# Patient Record
Sex: Female | Born: 1975 | Race: White | Hispanic: No | Marital: Married | Smoking: Never smoker
Health system: Southern US, Community
[De-identification: ages and names within clinical notes are randomized; demographics above are authoritative.]

---

## 2000-06-22 HISTORY — PX: TUBAL LIGATION: SHX77

## 2003-04-26 ENCOUNTER — Encounter: Payer: Self-pay | Admitting: Family Medicine

## 2003-04-26 LAB — CONVERTED CEMR LAB: Blood Glucose, Fasting: 86 mg/dL

## 2003-09-29 ENCOUNTER — Ambulatory Visit (HOSPITAL_COMMUNITY): Admission: RE | Admit: 2003-09-29 | Discharge: 2003-09-29 | Payer: Self-pay | Admitting: Neurology

## 2004-06-30 ENCOUNTER — Ambulatory Visit: Payer: Self-pay | Admitting: Family Medicine

## 2005-06-03 ENCOUNTER — Ambulatory Visit: Payer: Self-pay | Admitting: Family Medicine

## 2007-04-25 ENCOUNTER — Ambulatory Visit: Payer: Self-pay | Admitting: Family Medicine

## 2007-10-10 ENCOUNTER — Ambulatory Visit: Payer: Self-pay | Admitting: Family Medicine

## 2007-10-11 ENCOUNTER — Encounter: Payer: Self-pay | Admitting: Family Medicine

## 2007-12-01 ENCOUNTER — Ambulatory Visit: Payer: Self-pay | Admitting: Family Medicine

## 2007-12-01 DIAGNOSIS — M79609 Pain in unspecified limb: Secondary | ICD-10-CM | POA: Insufficient documentation

## 2007-12-12 ENCOUNTER — Ambulatory Visit: Payer: Self-pay | Admitting: Family Medicine

## 2007-12-13 ENCOUNTER — Encounter (INDEPENDENT_AMBULATORY_CARE_PROVIDER_SITE_OTHER): Payer: Self-pay | Admitting: Internal Medicine

## 2008-06-27 ENCOUNTER — Ambulatory Visit: Payer: Self-pay | Admitting: Family Medicine

## 2008-06-27 DIAGNOSIS — R358 Other polyuria: Secondary | ICD-10-CM

## 2008-06-27 DIAGNOSIS — N393 Stress incontinence (female) (male): Secondary | ICD-10-CM | POA: Insufficient documentation

## 2008-06-27 DIAGNOSIS — N3946 Mixed incontinence: Secondary | ICD-10-CM | POA: Insufficient documentation

## 2008-06-27 DIAGNOSIS — R3589 Other polyuria: Secondary | ICD-10-CM | POA: Insufficient documentation

## 2008-06-27 HISTORY — DX: Mixed incontinence: N39.46

## 2008-07-13 ENCOUNTER — Ambulatory Visit: Payer: Self-pay | Admitting: Family Medicine

## 2008-07-13 DIAGNOSIS — M546 Pain in thoracic spine: Secondary | ICD-10-CM

## 2008-07-13 DIAGNOSIS — N390 Urinary tract infection, site not specified: Secondary | ICD-10-CM | POA: Insufficient documentation

## 2008-07-13 LAB — CONVERTED CEMR LAB
Bilirubin Urine: NEGATIVE
Glucose, Urine, Semiquant: NEGATIVE
Nitrite: POSITIVE
Specific Gravity, Urine: 1.01
Urobilinogen, UA: 0.2
pH: 6

## 2008-12-31 ENCOUNTER — Ambulatory Visit: Payer: Self-pay | Admitting: Family Medicine

## 2008-12-31 DIAGNOSIS — L259 Unspecified contact dermatitis, unspecified cause: Secondary | ICD-10-CM | POA: Insufficient documentation

## 2008-12-31 LAB — CONVERTED CEMR LAB
Ketones, urine, test strip: NEGATIVE
Nitrite: NEGATIVE
Urobilinogen, UA: 0.2

## 2010-01-23 ENCOUNTER — Encounter (INDEPENDENT_AMBULATORY_CARE_PROVIDER_SITE_OTHER): Payer: Self-pay | Admitting: *Deleted

## 2010-04-01 ENCOUNTER — Ambulatory Visit: Payer: Self-pay | Admitting: Internal Medicine

## 2010-04-01 LAB — CONVERTED CEMR LAB
Glucose, Urine, Semiquant: NEGATIVE
Ketones, urine, test strip: NEGATIVE
Nitrite: NEGATIVE
Protein, U semiquant: 30

## 2010-07-22 NOTE — Assessment & Plan Note (Signed)
Summary: ?UTI/CLE   Vital Signs:  Patient profile:   35 year old female Height:      62 inches Weight:      130.75 pounds BMI:     24.00 Temp:     98.5 degrees F oral Pulse rate:   84 / minute Pulse rhythm:   regular BP sitting:   108 / 60  (left arm) Cuff size:   regular  Vitals Entered By: Selena Batten Dance CMA Duncan Dull) (April 01, 2010 4:28 PM) CC: ? UTI   History of Present Illness: CC: ?UTI  6-7 d h/o UTI sxs, feeling pressure when walking and urgency, shock feeling when voiding, + frequency.  has been drinking more water and less sodas, but still with sxs.  h/o UTIs in past, last year was last time.  no f/c/n/v/abd pain/flank pain.  Current Medications (verified): 1)  None  Allergies (verified): No Known Drug Allergies PMH-FH-SH reviewed for relevance  Review of Systems       per HPI  Physical Exam  General:  alert, well-developed, well-nourished, and well-hydrated.   Abdomen:  Bowel sounds positive,abdomen soft and non-tender without masses, organomegaly or hernias noted.  no CVA tenderness   Impression & Recommendations:  Problem # 1:  UTI (ICD-599.0)  Encouraged to push clear liquids, get enough rest. To be seen in 10 days if no improvement, sooner if worse.  The following medications were removed from the medication list:    Macrobid 100 Mg Caps (Nitrofurantoin monohyd macro) .Marland Kitchen... 1 by mouth two times a day Her updated medication list for this problem includes:    Bactrim Ds 800-160 Mg Tabs (Sulfamethoxazole-trimethoprim) .Marland Kitchen... Take one twice daily for 3 days  Orders: UA Dipstick W/ Micro (manual) (24401)  Complete Medication List: 1)  Bactrim Ds 800-160 Mg Tabs (Sulfamethoxazole-trimethoprim) .... Take one twice daily for 3 days  Patient Instructions: 1)  Looks like UTI - treat with bactrim twice daily for 3 days. 2)  push fluids, cranberry juice. 3)  Let us know if not improving as expected. Prescriptions: BACTRIM DS 800-160 MG TABS  (SULFAMETHOXAZOLE-TRIMETHOPRIM) take one twice daily for 3 days  #6 x 0   Entered and Authorized by:   Eustaquio Boyden  MD   Signed by:   Eustaquio Boyden  MD on 04/01/2010   Method used:   Electronically to        [facility] [id number] (retail[facility]       [address]       [place], [place]  [address]       Ph: [phone] or [phone]       Fax: [phone]   RxID:   [id number]   Prior Medications: Current Allergies (reviewed today): No known allergies   Laboratory Results   Urine Tests  Date/Time Received: April 01, 2010 4:30 PM  Date/Time Reported: April 01, 2010 4:30 PM   Routine Urinalysis   Color: lt. yellow Appearance: Cloudy Glucose: negative   (Normal Range: Negative) Bilirubin: negative   (Normal Range: Negative) Ketone: negative   (Normal Range: Negative) Spec. Gravity: 1.015   (Normal Range: 1.003-1.035) Blood: large   (Normal Range: Negative) pH: 8.0   (Normal Range: 5.0-8.0) Protein: 30   (Normal Range: Negative) Urobilinogen: 0.2   (Normal Range: 0-1) Nitrite: negative   (Normal Range: Negative) Leukocyte Esterace: large   (Normal Range: Negative)  Urine Microscopic WBC/HPF: 10-20 RBC/HPF: 0-3 Bacteria/HPF: 3+ Mucous/HPF: no Epithelial/HPF: 1-5 Crystals/HPF: no Casts/LPF: no Yeast/HPF: no    Comments: read  by ...............Eustaquio Boyden  MD  April 01, 2010 4:49 PM

## 2010-07-22 NOTE — Letter (Signed)
Summary: Nadara Eaton letter  [place] at [facility]  [address] [place], [place] [address]   Phone: [phone]  Fax: [phone]       01/23/2010 MRN: [id number]  Jennifer Shea [address] [place], [place]  [address]  Dear Ms. [facility] [place] and [place] announce the retirement of Arta Silence, M.D., from full-time practice at the [facility] office effective December 19, 2009 and his plans of returning part-time.  It is important to Dr. Hetty Ely and to our practice that you understand that [facility] has seven physicians in our office for your health care needs.  We will continue to offer the same exceptional care that you have today.    Dr. Hetty Ely has spoken to many of you about his plans for retirement and returning part-time in the fall.   We will continue to work with you through the transition to schedule appointments for you in the office and meet the high standards that [place] is committed to.   Again, it is with great pleasure that we share the news that Dr. Hetty Ely will return to [facility] in October of 2011 with a reduced schedule.    If you have any questions, or would like to request an appointment with one of our physicians, please call us at [phone] and press the option for Scheduling an appointment.  We take pleasure in providing you with excellent patient care and look forward to seeing you at your next office visit.  Our [facility] Physicians are:  Tillman Abide, M.D. Laurita Quint, M.D. Roxy Manns, M.D. Kerby Nora, M.D. Hannah Beat, M.D. Ruthe Mannan, M.D. We proudly welcomed Raechel Ache, M.D. and Eustaquio Boyden, M.D. to the practice in July/August 2011.  Sincerely,  [place] [facility]

## 2011-06-17 ENCOUNTER — Encounter: Payer: Self-pay | Admitting: Family Medicine

## 2011-06-17 ENCOUNTER — Ambulatory Visit (INDEPENDENT_AMBULATORY_CARE_PROVIDER_SITE_OTHER): Payer: BC Managed Care – PPO | Admitting: Family Medicine

## 2011-06-17 VITALS — BP 92/60 | HR 98 | Temp 98.2°F | Wt 129.5 lb

## 2011-06-17 DIAGNOSIS — J069 Acute upper respiratory infection, unspecified: Secondary | ICD-10-CM

## 2011-06-17 MED ORDER — AMOXICILLIN-POT CLAVULANATE 875-125 MG PO TABS: 1.0000 | ORAL_TABLET | Freq: Two times a day (BID) | ORAL | Status: AC

## 2011-06-17 MED ORDER — HYDROCOD POLST-CHLORPHEN POLST 10-8 MG/5ML PO LQCR: 5.0000 mL | Freq: Two times a day (BID) | ORAL | Status: DC | PRN

## 2011-06-17 NOTE — Progress Notes (Signed)
SUBJECTIVE:  Jennifer Shea is a 35 y.o. female who complains of coryza, congestion, sore throat, dry cough, myalgias and bilateral sinus pain for 10 days. She denies a history of anorexia, chest pain, shortness of breath and fever and denies a history of asthma. Patient denies smoke cigarettes.   Patient Active Problem List  Diagnoses  . UTI  . URINARY INCONTINENCE, STRESS, FEMALE  . CONTACT DERMATITIS&OTHER ECZEMA DUE UNSPEC CAUSE  . BACK PAIN, THORACIC REGION, RIGHT  . HAND PAIN, BILATERAL  . POLYURIA   No past medical history on file. No past surgical history on file. History  Substance Use Topics  . Smoking status: Never Smoker   . Smokeless tobacco: Not on file  . Alcohol Use: Not on file   Family History  Problem Relation Age of Onset  . Hypertension Father   . Cancer Maternal Grandfather    No Known Allergies No current outpatient prescriptions on file prior to visit.   The PMH, PSH, Social History, Family History, Medications, and allergies have been reviewed in [facility], and have been updated if relevant.  OBJECTIVE: BP 92/60  Pulse 98  Temp(Src) 98.2 F (36.8 C) (Oral)  Wt 129 lb 8 oz (58.741 kg)  LMP 06/09/2011  She appears well, vital signs are as noted. Ears normal.  Throat and pharynx normal.  Neck supple. No adenopathy in the neck. Nose is congested. Frontal Sinuses tender to palp. The chest is clear, without wheezes or rales.  ASSESSMENT:  sinusitis  PLAN: Given duration and progression of symptoms, will treat for bacterial sinusitis with Augmentin. Symptomatic therapy suggested: push fluids, rest and return office visit prn if symptoms persist or worsen.  Call or return to clinic prn if these symptoms worsen or fail to improve as anticipated.

## 2011-06-17 NOTE — Patient Instructions (Signed)
Take antibiotic as directed.  Drink lots of fluids.  Treat sympotmatically with Mucinex, nasal saline irrigation, and Tylenol/Ibuprofen. Also try claritin D or zyrtec D over the counter- two times a day as needed ( have to sign for them at pharmacy). You can use warm compresses.  Cough suppressant at night. Call if not improving as expected in 5-7 days.    

## 2011-08-10 ENCOUNTER — Ambulatory Visit (INDEPENDENT_AMBULATORY_CARE_PROVIDER_SITE_OTHER): Payer: BC Managed Care – PPO | Admitting: Family Medicine

## 2011-08-10 ENCOUNTER — Encounter: Payer: Self-pay | Admitting: Family Medicine

## 2011-08-10 DIAGNOSIS — Z2089 Contact with and (suspected) exposure to other communicable diseases: Secondary | ICD-10-CM

## 2011-08-10 DIAGNOSIS — J029 Acute pharyngitis, unspecified: Secondary | ICD-10-CM

## 2011-08-10 DIAGNOSIS — Z20818 Contact with and (suspected) exposure to other bacterial communicable diseases: Secondary | ICD-10-CM

## 2011-08-10 MED ORDER — AMOXICILLIN 875 MG PO TABS: 875.0000 mg | ORAL_TABLET | Freq: Two times a day (BID) | ORAL | Status: AC

## 2011-08-10 NOTE — Progress Notes (Signed)
  Subjective:    Patient ID: Jennifer Shea, female    DOB: 03-Sep-1975, 37 y.o.   MRN: [id number]  HPI CC: ST  3-4d h/o ST.  Worse at night and in morning.  Feels nauseated at times with meals.  Painful to swallow.  Taking robitussin.  No congestion, cough, fever.  No HA, abd pain.   Daughter with strep throat recently.  Husband sick prior as well.  No smokers at home.  No h/o asthma  Review of Systems Per HPI    Objective:   Physical Exam  Nursing note and vitals reviewed. Constitutional: She appears well-developed and well-nourished. No distress.  HENT:  Head: Normocephalic and atraumatic.  Right Ear: Hearing, tympanic membrane, external ear and ear canal normal.  Left Ear: Hearing, tympanic membrane, external ear and ear canal normal.  Nose: Nose normal. No mucosal edema or rhinorrhea. Right sinus exhibits no maxillary sinus tenderness and no frontal sinus tenderness. Left sinus exhibits no maxillary sinus tenderness and no frontal sinus tenderness.  Mouth/Throat: Uvula is midline and mucous membranes are normal. Posterior oropharyngeal erythema present. No oropharyngeal exudate, posterior oropharyngeal edema or tonsillar abscesses.       No tonsillar exudates  Eyes: EOM are normal. Pupils are equal, round, and reactive to light. No scleral icterus.  Neck: Normal range of motion. Neck supple.  Cardiovascular: Normal rate, regular rhythm, normal heart sounds and intact distal pulses.   No murmur heard. Pulmonary/Chest: Effort normal and breath sounds normal. No respiratory distress. She has no wheezes. She has no rales.  Lymphadenopathy:    She has no cervical adenopathy.  Skin: Skin is warm and dry. No rash noted.  Psychiatric: She has a normal mood and affect.       Assessment & Plan:

## 2011-08-10 NOTE — Assessment & Plan Note (Signed)
Bad sore throat, no cough.  RST negative however given strep exposure will treat mom as strep. Discussed this with mom. See pt instructions.

## 2011-08-10 NOTE — Patient Instructions (Signed)
You have pharyngitis.  Could be strep so will treat you with antibiotics. Push fluids and plenty of rest. May use ibuprofen for throat inflammation. Salt water gargles. Suck on cold things like popsicles or warm things like herbal teas (whichever soothes the throat better). Return if fever >101.5, worsening pain, or trouble opening/closing mouth, or hoarse voice. Good to see you today, call clinic with questions.

## 2012-09-13 ENCOUNTER — Encounter: Payer: Self-pay | Admitting: Family Medicine

## 2012-09-13 ENCOUNTER — Encounter: Payer: Self-pay | Admitting: *Deleted

## 2012-09-13 ENCOUNTER — Ambulatory Visit (INDEPENDENT_AMBULATORY_CARE_PROVIDER_SITE_OTHER): Payer: BC Managed Care – PPO | Admitting: Family Medicine

## 2012-09-13 VITALS — BP 100/78 | HR 100 | Temp 98.7°F | Ht 62.0 in | Wt 135.0 lb

## 2012-09-13 DIAGNOSIS — R05 Cough: Secondary | ICD-10-CM

## 2012-09-13 DIAGNOSIS — R059 Cough, unspecified: Secondary | ICD-10-CM

## 2012-09-13 MED ORDER — GUAIFENESIN-CODEINE 100-10 MG/5ML PO SYRP: 5.0000 mL | ORAL_SOLUTION | Freq: Every evening | ORAL | Status: DC | PRN

## 2012-09-13 NOTE — Assessment & Plan Note (Addendum)
Recently treated for what sounds like bronchitis vs CAP and reactive airways with levaquin, prednisone, and albuterol inhaler. Today breathing improved, normal lung exam but remains with cough. Treat with codeine cough syrup at night - discussed sedation precautions. Out of work 1 more day. Red flags to return discussed. Discussed continued use of prior meds as well.

## 2012-09-13 NOTE — Progress Notes (Signed)
  Subjective:    Patient ID: Jennifer Shea, female    DOB: 03/17/1976, 37 y.o.   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  Thinks dust exposure to school caused sickness.    Progressively worsening cough, body aches, fever to 102.  Rested over weekend.  On Sunday seen at [facility], treated with albuterol neb as well as concern for bacterial infection.  Placed on levaquin and prednisone Sunday morning.  Did feel better initially, but today cough returning along with chest tightness.  Was planning on returning to work tomorrow, wanted to be evaluated first.  No appetite.  Trouble drinking water even 2/2 no appetite. No abd pain, nausea, ear or tooth pain, rashes, ST, sinus congestion.  No h/o EIB (although doesn't exercise).  No smokers at home. No h/o asthma. No sick contacts at home.  History reviewed. No pertinent past medical history.   Review of Systems Per HPI    Objective:   Physical Exam  Nursing note and vitals reviewed. Constitutional: She appears well-developed and well-nourished. No distress.  HENT:  Head: Normocephalic and atraumatic.  Right Ear: Hearing, tympanic membrane, external ear and ear canal normal.  Left Ear: Hearing, tympanic membrane, external ear and ear canal normal.  Nose: No mucosal edema or rhinorrhea. Right sinus exhibits no maxillary sinus tenderness and no frontal sinus tenderness. Left sinus exhibits no maxillary sinus tenderness and no frontal sinus tenderness.  Mouth/Throat: Uvula is midline, oropharynx is clear and moist and mucous membranes are normal. No oropharyngeal exudate, posterior oropharyngeal edema, posterior oropharyngeal erythema or tonsillar abscesses.  R cerumen in canal  Eyes: Conjunctivae and EOM are normal. Pupils are equal, round, and reactive to light. No scleral icterus.  Neck: Normal range of motion. Neck supple. No JVD present.  Cardiovascular: Normal  rate, regular rhythm, normal heart sounds and intact distal pulses.   No murmur heard. Pulmonary/Chest: Effort normal and breath sounds normal. No respiratory distress. She has no wheezes. She has no rales.  No wheezing. Moving air well. Normal work of breathing  Lymphadenopathy:    She has no cervical adenopathy.  Skin: Skin is warm and dry. No rash noted.       Assessment & Plan:

## 2012-09-13 NOTE — Patient Instructions (Signed)
I do think you did have reaction to dust in new building - continue meds as up to now.  Add on codeine cough medicine at night to help sleep. Let me know if fever >101 or worsening cough instead of improving. May use nasal saline irrigation as well.

## 2013-07-31 ENCOUNTER — Ambulatory Visit (INDEPENDENT_AMBULATORY_CARE_PROVIDER_SITE_OTHER): Payer: BC Managed Care – PPO | Admitting: Internal Medicine

## 2013-07-31 ENCOUNTER — Encounter: Payer: Self-pay | Admitting: Internal Medicine

## 2013-07-31 VITALS — BP 130/76 | HR 106 | Temp 99.1°F | Wt 133.8 lb

## 2013-07-31 DIAGNOSIS — H6120 Impacted cerumen, unspecified ear: Secondary | ICD-10-CM

## 2013-07-31 DIAGNOSIS — J019 Acute sinusitis, unspecified: Secondary | ICD-10-CM

## 2013-07-31 MED ORDER — AMOXICILLIN-POT CLAVULANATE 875-125 MG PO TABS: 1.0000 | ORAL_TABLET | Freq: Two times a day (BID) | ORAL | Status: DC

## 2013-07-31 MED ORDER — FLUTICASONE PROPIONATE 50 MCG/ACT NA SUSP: 2.0000 | Freq: Every day | NASAL | Status: DC

## 2013-07-31 NOTE — Patient Instructions (Signed)

## 2013-07-31 NOTE — Progress Notes (Signed)
Pre-visit discussion using our clinic review tool. No additional management support is needed unless otherwise documented below in the visit note.  

## 2013-07-31 NOTE — Progress Notes (Addendum)
HPI  Pt presents to the clinic today with c/o cough, headache, facial pain, teeth pain, ear pain and fever. She reports her symptoms started about 4 days ago. She is blowing mucous out of her nose, it started out clear but seems to be turning yellow at this time. She has tried Mucinex with only little relief. She reports that her symptoms are getting worse. She has no history of allergies or breathing problems. She has had sick contacts.  Review of Systems   History reviewed. No pertinent past medical history.  Family History  Problem Relation Age of Onset  . Hypertension Father   . Cancer Maternal Grandfather     History   Social History  . Marital Status: Married    Spouse Name: N/A    Number of Children: 2  . Years of Education: N/A   Occupational History  . Not on file.   Social History Main Topics  . Smoking status: Never Smoker   . Smokeless tobacco: Not on file  . Alcohol Use: Not on file  . Drug Use: Not on file  . Sexual Activity: Not on file   Other Topics Concern  . Not on file   Social History Narrative  . No narrative on file    No Known Allergies   Constitutional: Positive headache, fatigue and fever. Denies abrupt weight changes.  HEENT:  Positive eye pain, pressure behind the eyes, facial pain, nasal congestion and sore throat. Denies eye redness, ear pain, ringing in the ears, wax buildup, runny nose or bloody nose. Respiratory: Positive cough. Denies difficulty breathing or shortness of breath.  Cardiovascular: Denies chest pain, chest tightness, palpitations or swelling in the hands or feet.   No other specific complaints in a complete review of systems (except as listed in HPI above).  Objective:    General: Appears her stated age, well developed, well nourished in NAD. HEENT: Head: normal shape and size, maxillary sinus tenderness noted; Eyes: sclera white, no icterus, conjunctiva pink, PERRLA and EOMs intact;  Right Ear: cerumen impaction.  Left Ears: Tm's gray and intact, normal light reflex; Nose: mucosa pink and moist, septum midline; Throat/Mouth: + PND. Teeth present, mucosa pink and moist, no exudate noted, no lesions or ulcerations noted.  Neck: Neck supple, trachea midline. No massses, lumps or thyromegaly present.  Cardiovascular: Normal rate and rhythm. S1,S2 noted.  No murmur, rubs or gallops noted. No JVD or BLE edema. No carotid bruits noted. Pulmonary/Chest: Normal effort and positive vesicular breath sounds. No respiratory distress. No wheezes, rales or ronchi noted.      Assessment & Plan:   Acute bacterial sinusitis  Can use a Neti Pot which can be purchased from your local drug store. Flonase 2 sprays each nostril for 3 days and then as needed. Augmentin BID for 10 days Work note provided  Cerumen impaction right ear:  She declines lavage at this time Advised her to try Debrox OTC to help soften the wax and allow it to drain  RTC as needed or if symptoms persist.

## 2013-10-25 ENCOUNTER — Encounter: Payer: Self-pay | Admitting: Internal Medicine

## 2013-10-25 ENCOUNTER — Ambulatory Visit (INDEPENDENT_AMBULATORY_CARE_PROVIDER_SITE_OTHER): Payer: BC Managed Care – PPO | Admitting: Internal Medicine

## 2013-10-25 VITALS — BP 106/64 | HR 102 | Temp 98.1°F | Wt 138.0 lb

## 2013-10-25 DIAGNOSIS — N644 Mastodynia: Secondary | ICD-10-CM

## 2013-10-25 NOTE — Progress Notes (Signed)
   Subjective:    Patient ID: Jennifer Shea, female    DOB: [birth year], 38 y.o.   MRN: [id number]  HPI  Pt presents to the clinic today with c/o pain and discomfort in Jennifer Shea left breasth. She noticed this 1 week ago. She describes it as a pressure sensation, like it feels extra heavy and swollen. She doe not perform monthly SBE. Jennifer Shea LMP was 10/15/13.  Review of Systems  History reviewed. No pertinent past medical history.  Current Outpatient Prescriptions  Medication Sig Dispense Refill  . albuterol (PROVENTIL HFA;VENTOLIN HFA) 108 (90 BASE) MCG/ACT inhaler Inhale into the lungs every 6 (six) hours as needed for wheezing.      . fluticasone (FLONASE) 50 MCG/ACT nasal spray Place 2 sprays into both nostrils daily.  16 g  6   No current facility-administered medications for this visit.    No Known Allergies  Family History  Problem Relation Age of Onset  . Hypertension Father   . Cancer Maternal Grandfather     History   Social History  . Marital Status: Married    Spouse Name: N/A    Number of Children: 2  . Years of Education: N/A   Occupational History  . Not on file.   Social History Main Topics  . Smoking status: Never Smoker   . Smokeless tobacco: Not on file  . Alcohol Use: Not on file  . Drug Use: Not on file  . Sexual Activity: Not on file   Other Topics Concern  . Not on file   Social History Narrative  . No narrative on file     Constitutional: Denies fever, malaise, fatigue, headache or abrupt weight changes.  Respiratory: Denies difficulty breathing, shortness of breath, cough or sputum production.   Cardiovascular: Denies chest pain, chest tightness, palpitations or swelling in the hands or feet.  Skin: Pt reports left breast pain/tenderness. Denies redness, rashes, lesions or ulcercations.    No other specific complaints in a complete review of systems (except as listed in HPI above).     Objective:   Physical Exam   BP 106/64  Pulse 102   Temp(Src) 98.1 F (36.7 C) (Oral)  Wt 138 lb (62.596 kg)  SpO2 98% Wt Readings from Last 3 Encounters:  10/25/13 138 lb (62.596 kg)  07/31/13 133 lb 12 oz (60.669 kg)  09/13/12 135 lb (61.236 kg)    General: Appears Jennifer Shea stated age, well developed, well nourished in NAD. Skin: Warm, dry and intact. No rashes, lesions or ulcerations noted. Fibrocystic breast changes at 6 oclock. No discrete palpable mass. Heart: Normal rate and rhythm. S1,S2 noted.  No murmur, rubs or gallops noted. No JVD or BLE edema. No carotid bruits noted. Pulmonary/Chest: Normal effort and positive vesicular breath sounds. No respiratory distress. No wheezes, rales or ronchi noted.         Assessment & Plan:   Left breast pain and tenderness:  Will obtain diagnostic mammogram and ultrasound left breast  Will follow up after results are back

## 2013-10-25 NOTE — Progress Notes (Signed)
Pre visit review using our clinic review tool, if applicable. No additional management support is needed unless otherwise documented below in the visit note. 

## 2013-10-25 NOTE — Patient Instructions (Addendum)
Breast Tenderness Breast tenderness is a common problem for women of all ages. Breast tenderness may cause mild discomfort to severe pain. It has a variety of causes. Your health care provider will find out the likely cause of your breast tenderness by examining your breasts, asking you about symptoms, and ordering some tests. Breast tenderness usually does not mean you have breast cancer. HOME CARE INSTRUCTIONS  Breast tenderness often can be handled at home. You can try:  Getting fitted for a new bra that provides more support, especially during exercise.  Wearing a more supportive bra or sports bra while sleeping when your breasts are very tender.  If you have a breast injury, apply ice to the area:  Put ice in a plastic bag.  Place a towel between your skin and the bag.  Leave the ice on for 20 minutes, 2 3 times a day.  If your breasts are too full of milk as a result of breastfeeding, try:  Expressing milk either by hand or with a breast pump.  Applying a warm compress to the breasts for relief.  Taking over-the-counter pain relievers, if approved by your health care provider.  Taking other medicines that your health care provider prescribes. These may include antibiotic medicines or birth control pills. Over the long term, your breast tenderness might be eased if you:  Cut down on caffeine.  Reduce the amount of fat in your diet. Keep a log of the days and times when your breasts are most tender. This will help you and your health care provider find the cause of the tenderness and how to relieve it. Also, learn how to do breast exams at home. This will help you notice if you have an unusual growth or lump that could cause tenderness. SEEK MEDICAL CARE IF:   Any part of your breast is hard, red, and hot to the touch. This could be a sign of infection.  Fluid is coming out of your nipples (and you are not breastfeeding). Especially watch for blood or pus.  You have a fever  as well as breast tenderness.  You have a new or painful lump in your breast that remains after your menstrual period ends.  You have tried to take care of the pain at home, but it has not gone away.  Your breast pain is getting worse, or the pain is making it hard to do the things you usually do during your day. Document Released: 05/21/2008 Document Revised: 02/08/2013 Document Reviewed: 01/05/2013 [employer] Patient Information 2014 [employer].  

## 2013-10-31 ENCOUNTER — Other Ambulatory Visit: Payer: Self-pay | Admitting: Internal Medicine

## 2013-10-31 DIAGNOSIS — N644 Mastodynia: Secondary | ICD-10-CM

## 2013-11-02 ENCOUNTER — Ambulatory Visit: Payer: Self-pay | Admitting: Internal Medicine

## 2013-11-02 ENCOUNTER — Encounter: Payer: Self-pay | Admitting: Internal Medicine

## 2013-12-25 ENCOUNTER — Encounter: Payer: Self-pay | Admitting: Family Medicine

## 2013-12-25 ENCOUNTER — Ambulatory Visit (INDEPENDENT_AMBULATORY_CARE_PROVIDER_SITE_OTHER): Payer: BC Managed Care – PPO | Admitting: Family Medicine

## 2013-12-25 VITALS — BP 118/66 | HR 88 | Temp 98.2°F | Wt 143.2 lb

## 2013-12-25 DIAGNOSIS — R21 Rash and other nonspecific skin eruption: Secondary | ICD-10-CM

## 2013-12-25 MED ORDER — PERMETHRIN 5 % EX CREA: 1.0000 "application " | TOPICAL_CREAM | Freq: Once | CUTANEOUS | Status: DC

## 2013-12-25 NOTE — Progress Notes (Signed)
Pre visit review using our clinic review tool, if applicable. No additional management support is needed unless otherwise documented below in the visit note. 

## 2013-12-25 NOTE — Patient Instructions (Signed)
I wonder about scabies - treat with permethrin cream to entire body one night then wash off next morning. May use benadryl as needed for itch, oatmeal bath, try to avoid scratching to avoid worsening or breaking skin.  Scabies Scabies are small bugs (mites) that burrow under the skin and cause red bumps and severe itching. These bugs can only be seen with a microscope. Scabies are highly contagious. They can spread easily from person to person by direct contact. They are also spread through sharing clothing or linens that have the scabies mites living in them. It is not unusual for an entire family to become infected through shared towels, clothing, or bedding.  HOME CARE INSTRUCTIONS   Your caregiver may prescribe a cream or lotion to kill the mites. If cream is prescribed, massage the cream into the entire body from the neck to the bottom of both feet. Also massage the cream into the scalp and face if your child is less than 37 year old. Avoid the eyes and mouth. Do not wash your hands after application.  Leave the cream on for 8 to 12 hours. Your child should bathe or shower after the 8 to 12 hour application period. Sometimes it is helpful to apply the cream to your child right before bedtime.  One treatment is usually effective and will eliminate approximately 95% of infestations. For severe cases, your caregiver may decide to repeat the treatment in 1 week. Everyone in your household should be treated with one application of the cream.  New rashes or burrows should not appear within 24 to 48 hours after successful treatment. However, the itching and rash may last for 2 to 4 weeks after successful treatment. Your caregiver may prescribe a medicine to help with the itching or to help the rash go away more quickly.  Scabies can live on clothing or linens for up to 3 days. All of your child's recently used clothing, towels, stuffed toys, and bed linens should be washed in hot water and then dried in  a dryer for at least 20 minutes on high heat. Items that cannot be washed should be enclosed in a plastic bag for at least 3 days.  To help relieve itching, bathe your child in a cool bath or apply cool washcloths to the affected areas.  Your child may return to school after treatment with the prescribed cream. SEEK MEDICAL CARE IF:   The itching persists longer than 4 weeks after treatment.  The rash spreads or becomes infected. Signs of infection include red blisters or yellow-tan crust. Document Released: 06/08/2005 Document Revised: 08/31/2011 Document Reviewed: 10/17/2008 [facility] Patient Information 2015 [place], [place]. This information is not intended to replace advice given to you by your health care provider. Make sure you discuss any questions you have with your health care provider.

## 2013-12-25 NOTE — Assessment & Plan Note (Signed)
Rash most consistent with scabies given daughter and husband with similar rash at home, papular and pruritic nature of rash. Treat with permethrin cream, discussed use of cream. Update if rash persists or deteriorates.

## 2013-12-25 NOTE — Progress Notes (Signed)
   BP 118/66  Pulse 88  Temp(Src) 98.2 F (36.8 C) (Oral)  Wt 143 lb 4 oz (64.978 kg)  LMP 12/11/2013   CC: rash  Subjective:    Patient ID: Jennifer Shea, female    DOB: Apr 14, 1976, 38 y.o.   MRN: [id number]  HPI: Jennifer Shea is a 38 y.o. female presenting on 12/25/2013 for Rash   Rash started Friday evening. Neck rash, arm rash. Pruritic. Daughter recently evaluated at ER with similar rash. Prednisone course did not help daughter.  No new lotions, detergents, soaps or shampoos, no new medicines, no new foods.  Husband also noticed itchy rash Saturday night.  Entire family working outside around shed building stairs with old wood. Lots of insects in old wood No recent campfire or bonfire exposure. Mom has tried OTC cortisone cream which didn't help.  Relevant past medical, surgical, family and social history reviewed and updated as indicated.  Allergies and medications reviewed and updated. Current Outpatient Prescriptions on File Prior to Visit  Medication Sig  . albuterol (PROVENTIL HFA;VENTOLIN HFA) 108 (90 BASE) MCG/ACT inhaler Inhale into the lungs every 6 (six) hours as needed for wheezing.  . fluticasone (FLONASE) 50 MCG/ACT nasal spray Place 2 sprays into both nostrils daily.   No current facility-administered medications on file prior to visit.    Review of Systems Per HPI unless specifically indicated above    Objective:    BP 118/66  Pulse 88  Temp(Src) 98.2 F (36.8 C) (Oral)  Wt 143 lb 4 oz (64.978 kg)  LMP 12/11/2013  Physical Exam  Nursing note and vitals reviewed. Constitutional: She appears well-developed and well-nourished. No distress.  Skin: Skin is warm and dry. Rash noted. There is erythema.  Erythematous intensely pruritic papular rash on AC fossae, neck Faint papules on dorsal hands, some in linear burrow distribution       Assessment & Plan:   Problem List Items Addressed This Visit   Skin rash - Primary     Rash most consistent  with scabies given daughter and husband with similar rash at home, papular and pruritic nature of rash. Treat with permethrin cream, discussed use of cream. Update if rash persists or deteriorates.        Follow up plan: Return if symptoms worsen or fail to improve.

## 2014-01-01 ENCOUNTER — Encounter: Payer: Self-pay | Admitting: Family Medicine

## 2014-01-01 ENCOUNTER — Ambulatory Visit (INDEPENDENT_AMBULATORY_CARE_PROVIDER_SITE_OTHER): Payer: BC Managed Care – PPO | Admitting: Family Medicine

## 2014-01-01 VITALS — BP 124/76 | HR 84 | Temp 98.2°F | Wt 145.5 lb

## 2014-01-01 DIAGNOSIS — R21 Rash and other nonspecific skin eruption: Secondary | ICD-10-CM

## 2014-01-01 MED ORDER — PREDNISONE 20 MG PO TABS: ORAL_TABLET | ORAL | Status: DC

## 2014-01-01 NOTE — Progress Notes (Signed)
   BP 124/76  Pulse 84  Temp(Src) 98.2 F (36.8 C) (Oral)  Wt 145 lb 8 oz (65.998 kg)  LMP 12/11/2013   CC: 1 wk f/u  Subjective:    Patient ID: Jennifer Shea, female    DOB: [birth year], 38 y.o.   MRN: [id number]  HPI: Jennifer Shea is a 38 y.o. female presenting on 01/01/2014 for Follow-up   See prior note for details. Last visit, seen with daughter with concern for scabies infection. Treated family with permethrin cream. Family's sxs have improved.  Pt's rash persists. New rash spreading on arms and legs. Rash remaining pruritic but also burning pain. Has been taking hydroxyzine for itch. No other new lotions, creams, detergents.  Did have vomiting episode last night. No lip or tongue or throat swelling, no dyspnea, no oral lesions, no fevers/chills.  From last visit: Rash started Friday evening. Neck rash, arm rash. Pruritic.  Daughter recently evaluated at ER with similar rash. Prednisone course did not help daughter.  No new lotions, detergents, soaps or shampoos, no new medicines, no new foods.  Husband also noticed itchy rash Saturday night.  Entire family working outside around shed building stairs with old wood. Lots of insects in old wood  No recent campfire or bonfire exposure.  Mom has tried OTC cortisone cream which didn't help.   Relevant past medical, surgical, family and social history reviewed and updated as indicated.  Allergies and medications reviewed and updated. Current Outpatient Prescriptions on File Prior to Visit  Medication Sig  . albuterol (PROVENTIL HFA;VENTOLIN HFA) 108 (90 BASE) MCG/ACT inhaler Inhale into the lungs every 6 (six) hours as needed for wheezing.  . fluticasone (FLONASE) 50 MCG/ACT nasal spray Place 2 sprays into both nostrils daily.  . permethrin (ACTICIN) 5 % cream Apply 1 application topically once.   No current facility-administered medications on file prior to visit.    Review of Systems Per HPI unless specifically  indicated above    Objective:    BP 124/76  Pulse 84  Temp(Src) 98.2 F (36.8 C) (Oral)  Wt 145 lb 8 oz (65.998 kg)  LMP 12/11/2013  Physical Exam  Nursing note and vitals reviewed. Constitutional: She appears well-developed and well-nourished. No distress.  Skin: Skin is warm and dry. Rash noted. There is erythema.  Maculopapular rash on bilateral arms, legs, spares trunk. Papules converge to form large erythematous patches on flexor surfaces of forearms and on flexor surfaces of knees. Pruritic, burning pain. No oral lesions       Assessment & Plan:   Problem List Items Addressed This Visit   Skin rash - Primary     Anticipate skin reaction to permethrin cream as she used med too soon (2 days apart). Treat inflammatory reaction with prednisone 8d taper. rec hydroxyzine or benadryl as needed for itch, cool compresses to skin, update if not improving as expected or any worsneing.        Follow up plan: Return if symptoms worsen or fail to improve.

## 2014-01-01 NOTE — Assessment & Plan Note (Signed)
Anticipate skin reaction to permethrin cream as she used med too soon (2 days apart). Treat inflammatory reaction with prednisone 8d taper. rec hydroxyzine or benadryl as needed for itch, cool compresses to skin, update if not improving as expected or any worsneing.

## 2014-01-01 NOTE — Progress Notes (Signed)
Pre visit review using our clinic review tool, if applicable. No additional management support is needed unless otherwise documented below in the visit note. 

## 2014-01-01 NOTE — Patient Instructions (Signed)
I think your skin reacted to the permethrin lotion - treat with prednisone taper for the next week. Cool compresses on skin. May continue hydroxyzine or benadryl as needed. Watch for fever >101 or return of nausea/vomiting or spreading and worsening rash despite prednisone. Good to see you today, call us with quesitons.

## 2014-11-21 ENCOUNTER — Encounter: Payer: Self-pay | Admitting: Primary Care

## 2014-11-21 ENCOUNTER — Ambulatory Visit (INDEPENDENT_AMBULATORY_CARE_PROVIDER_SITE_OTHER): Payer: BC Managed Care – PPO | Admitting: Primary Care

## 2014-11-21 VITALS — BP 110/64 | HR 93 | Temp 97.9°F | Ht 62.0 in | Wt 130.1 lb

## 2014-11-21 DIAGNOSIS — R350 Frequency of micturition: Secondary | ICD-10-CM

## 2014-11-21 LAB — POCT URINALYSIS DIPSTICK
Bilirubin, UA: NEGATIVE
GLUCOSE UA: NEGATIVE
KETONES UA: NEGATIVE
NITRITE UA: NEGATIVE
Protein, UA: NEGATIVE
Spec Grav, UA: 1.025
UROBILINOGEN UA: NEGATIVE
pH, UA: 6

## 2014-11-21 MED ORDER — SULFAMETHOXAZOLE-TRIMETHOPRIM 800-160 MG PO TABS: 1.0000 | ORAL_TABLET | Freq: Two times a day (BID) | ORAL | Status: DC

## 2014-11-21 MED ORDER — PHENAZOPYRIDINE HCL 200 MG PO TABS: 200.0000 mg | ORAL_TABLET | Freq: Three times a day (TID) | ORAL | Status: DC | PRN

## 2014-11-21 NOTE — Progress Notes (Signed)
   Subjective:    Patient ID: Jennifer Shea, female    DOB: 1976/05/19, 39 y.o.   MRN: [id number]  HPI  Jennifer Shea is a 39 year old female who presents today with a chief complaint of urinary frequency and urgency with some pelvic pressure. She denies dysuria, vaginal discharge, vaginal itching. Her symptoms have been present since Monday and have worsened today. She has not taken anything OTC for her symptoms.  Review of Systems  Constitutional: Negative for fever and chills.  Genitourinary: Positive for urgency and frequency. Negative for dysuria, hematuria, flank pain, vaginal discharge, difficulty urinating and pelvic pain.       No past medical history on file.  History   Social History  . Marital Status: Married    Spouse Name: N/A  . Number of Children: 2  . Years of Education: N/A   Occupational History  . Not on file.   Social History Main Topics  . Smoking status: Never Smoker   . Smokeless tobacco: Not on file  . Alcohol Use: Not on file  . Drug Use: Not on file  . Sexual Activity: Not on file   Other Topics Concern  . Not on file   Social History Narrative    No past surgical history on file.  Family History  Problem Relation Age of Onset  . Hypertension Father   . Cancer Maternal Grandfather     No Known Allergies  No current outpatient prescriptions on file prior to visit.   No current facility-administered medications on file prior to visit.    BP 110/64 mmHg  Pulse 93  Temp(Src) 97.9 F (36.6 C) (Oral)  Ht 5\' 2"  (1.575 m)  Wt 130 lb 1.9 oz (59.022 kg)  BMI 23.79 kg/m2  SpO2 99%  LMP 11/21/2014    Objective:   Physical Exam  Constitutional: She appears well-nourished.  Cardiovascular: Normal rate and regular rhythm.   Pulmonary/Chest: Effort normal and breath sounds normal.  Abdominal: There is no CVA tenderness.  Skin: Skin is warm and dry.          Assessment & Plan:  Urinary Tract Infection:  UA: Positive leuks and  blood (she is currently menstruating). Neg nitrites and glucose. Increase consumption of water intake. RX for Bactrim DS BID x 3 days. Pyridium PRN for symptoms. Culture sent. Follow up if no improvement in 3-4 days.

## 2014-11-21 NOTE — Addendum Note (Signed)
Addended by: Tawnya CrookSAMBATH, Madine Sarr on: 11/21/2014 11:41 AM   Modules accepted: Orders

## 2014-11-21 NOTE — Patient Instructions (Addendum)
Your urine test shows a urinary tract infection. Start Bactrim DS tablets. Take 1 tablet by mouth twice daily for three days. You may take pyridium three times daily as needed for symptoms. Follow up if no improvement in 3-4 days.  Urinary Tract Infection Urinary tract infections (UTIs) can develop anywhere along your urinary tract. Your urinary tract is your body's drainage system for removing wastes and extra water. Your urinary tract includes two kidneys, two ureters, a bladder, and a urethra. Your kidneys are a pair of bean-shaped organs. Each kidney is about the size of your fist. They are located below your ribs, one on each side of your spine. CAUSES Infections are caused by microbes, which are microscopic organisms, including fungi, viruses, and bacteria. These organisms are so small that they can only be seen through a microscope. Bacteria are the microbes that most commonly cause UTIs. SYMPTOMS  Symptoms of UTIs may vary by age and gender of the patient and by the location of the infection. Symptoms in young women typically include a frequent and intense urge to urinate and a painful, burning feeling in the bladder or urethra during urination. Older women and men are more likely to be tired, shaky, and weak and have muscle aches and abdominal pain. A fever may mean the infection is in your kidneys. Other symptoms of a kidney infection include pain in your back or sides below the ribs, nausea, and vomiting. DIAGNOSIS To diagnose a UTI, your caregiver will ask you about your symptoms. Your caregiver also will ask to provide a urine sample. The urine sample will be tested for bacteria and white blood cells. White blood cells are made by your body to help fight infection. TREATMENT  Typically, UTIs can be treated with medication. Because most UTIs are caused by a bacterial infection, they usually can be treated with the use of antibiotics. The choice of antibiotic and length of treatment depend  on your symptoms and the type of bacteria causing your infection. HOME CARE INSTRUCTIONS  If you were prescribed antibiotics, take them exactly as your caregiver instructs you. Finish the medication even if you feel better after you have only taken some of the medication.  Drink enough water and fluids to keep your urine clear or pale yellow.  Avoid caffeine, tea, and carbonated beverages. They tend to irritate your bladder.  Empty your bladder often. Avoid holding urine for long periods of time.  Empty your bladder before and after sexual intercourse.  After a bowel movement, women should cleanse from front to back. Use each tissue only once. SEEK MEDICAL CARE IF:   You have back pain.  You develop a fever.  Your symptoms do not begin to resolve within 3 days. SEEK IMMEDIATE MEDICAL CARE IF:   You have severe back pain or lower abdominal pain.  You develop chills.  You have nausea or vomiting.  You have continued burning or discomfort with urination. MAKE SURE YOU:   Understand these instructions.  Will watch your condition.  Will get help right away if you are not doing well or get worse. Document Released: 03/18/2005 Document Revised: 12/08/2011 Document Reviewed: 07/17/2011 [facility]Exit[facility] Patient Information 2015 [place] Beach[employer], [place]. This information is not intended to replace advice given to you by your health care provider. Make sure you discuss any questions you have with your health care provider.

## 2014-11-21 NOTE — Progress Notes (Signed)
Pre visit review using our clinic review tool, if applicable. No additional management support is needed unless otherwise documented below in the visit note. 

## 2014-11-23 LAB — URINE CULTURE

## 2014-12-03 ENCOUNTER — Ambulatory Visit: Payer: BC Managed Care – PPO | Admitting: Family Medicine

## 2014-12-03 ENCOUNTER — Telehealth: Payer: Self-pay | Admitting: Family Medicine

## 2014-12-03 DIAGNOSIS — Z0289 Encounter for other administrative examinations: Secondary | ICD-10-CM

## 2014-12-03 NOTE — Telephone Encounter (Signed)
Patient did not come in for their appointment today for cough .  Please let me know if patient needs to be contacted immediately for follow up or no follow up needed. °

## 2014-12-03 NOTE — Telephone Encounter (Signed)
No need to f/u

## 2015-09-02 ENCOUNTER — Encounter: Payer: Self-pay | Admitting: Physician Assistant

## 2015-09-02 ENCOUNTER — Ambulatory Visit (INDEPENDENT_AMBULATORY_CARE_PROVIDER_SITE_OTHER): Payer: BC Managed Care – PPO | Admitting: Physician Assistant

## 2015-09-02 VITALS — BP 118/60 | HR 110 | Temp 99.6°F | Ht 62.0 in | Wt 138.6 lb

## 2015-09-02 DIAGNOSIS — R05 Cough: Secondary | ICD-10-CM

## 2015-09-02 DIAGNOSIS — R509 Fever, unspecified: Secondary | ICD-10-CM | POA: Diagnosis not present

## 2015-09-02 DIAGNOSIS — R6889 Other general symptoms and signs: Secondary | ICD-10-CM

## 2015-09-02 LAB — POC INFLUENZA A&B (BINAX/QUICKVUE)
Influenza A, POC: NEGATIVE
Influenza B, POC: NEGATIVE

## 2015-09-02 MED ORDER — BENZONATATE 100 MG PO CAPS: 100.0000 mg | ORAL_CAPSULE | Freq: Three times a day (TID) | ORAL | Status: DC | PRN

## 2015-09-02 MED ORDER — OSELTAMIVIR PHOSPHATE 75 MG PO CAPS: 75.0000 mg | ORAL_CAPSULE | Freq: Two times a day (BID) | ORAL | Status: DC

## 2015-09-02 NOTE — Addendum Note (Signed)
Addended by: Verdie ShireBAYNES, Adrie Picking M on: 09/02/2015 03:58 PM   Modules accepted: Orders

## 2015-09-02 NOTE — Progress Notes (Signed)
Pre visit review using our clinic review tool, if applicable. No additional management support is needed unless otherwise documented below in the visit note. 

## 2015-09-02 NOTE — Progress Notes (Signed)
   Patient presents to clinic today c/o 2 days of fever, aches, fatigue and cough (dry). Endorses mild sore throat. Denies chest pain or shortness of breath. Denies history of asthma or allergy.   No past medical history on file.  No current outpatient prescriptions on file prior to visit.   No current facility-administered medications on file prior to visit.    No Known Allergies  Family History  Problem Relation Age of Onset  . Hypertension Father   . Cancer Maternal Grandfather     Social History   Social History  . Marital Status: Married    Spouse Name: N/A  . Number of Children: 2  . Years of Education: N/A   Social History Main Topics  . Smoking status: Never Smoker   . Smokeless tobacco: None  . Alcohol Use: None  . Drug Use: None  . Sexual Activity: Not Asked   Other Topics Concern  . None   Social History Narrative   Review of Systems - See HPI.  All other ROS are negative.  BP 118/60 mmHg  Pulse 110  Temp(Src) 99.6 F (37.6 C) (Oral)  Ht 5\' 2"  (1.575 m)  Wt 138 lb 9.6 oz (62.869 kg)  BMI 25.34 kg/m2  SpO2 99%  LMP 08/26/2015  Physical Exam  Constitutional: She is oriented to person, place, and time and well-developed, well-nourished, and in no distress.  HENT:  Head: Normocephalic and atraumatic.  Right Ear: External ear normal.  Left Ear: External ear normal.  Nose: Nose normal.  Mouth/Throat: Oropharynx is clear and moist. No oropharyngeal exudate.  TM within normal limits.  Eyes: Conjunctivae are normal.  Neck: Neck supple.  Cardiovascular: Normal rate, regular rhythm, normal heart sounds and intact distal pulses.   Pulmonary/Chest: Effort normal and breath sounds normal. No respiratory distress. She has no wheezes. She has no rales. She exhibits no tenderness.  Lymphadenopathy:    She has no cervical adenopathy.  Neurological: She is alert and oriented to person, place, and time.  Skin: Skin is warm and dry. No rash noted.    Psychiatric: Affect normal.  Vitals reviewed.    Assessment/Plan: Flu-like symptoms Flu swab negative. Classic symptoms. Patient around young. Rx Tamiflu. Tessalon per orders. fOllow-up if not resolving.

## 2015-09-02 NOTE — Assessment & Plan Note (Signed)
Flu swab negative. Classic symptoms. Patient around young. Rx Tamiflu. Tessalon per orders. fOllow-up if not resolving.

## 2015-09-02 NOTE — Patient Instructions (Signed)
Take Tamiflu and Tessalon as directed. Increase fluids.  Place a humidifier in the bedroom. Continue plain Mucinex.  Follow-up if symptoms are not resolving over next 4-5 days.

## 2015-09-05 ENCOUNTER — Ambulatory Visit (INDEPENDENT_AMBULATORY_CARE_PROVIDER_SITE_OTHER): Payer: BC Managed Care – PPO | Admitting: Family Medicine

## 2015-09-05 ENCOUNTER — Encounter: Payer: Self-pay | Admitting: Family Medicine

## 2015-09-05 VITALS — BP 120/70 | HR 91 | Temp 97.9°F | Ht 63.0 in | Wt 137.6 lb

## 2015-09-05 DIAGNOSIS — H6982 Other specified disorders of Eustachian tube, left ear: Secondary | ICD-10-CM

## 2015-09-05 MED ORDER — PREDNISONE 20 MG PO TABS: ORAL_TABLET | ORAL | Status: DC

## 2015-09-05 NOTE — Patient Instructions (Signed)
It seems that your eustachian tube (connection between your ears and nose) is swollen and not allowing your ears to equalize properly Try the prednisone for 3 days. You might also try an OTC decongestant such as pseudoephedrine (sudafed)- this may be even more helpful but can make you feel jittery Let me know if your symptoms are not resolved over the next couple of days- Sooner if worse.

## 2015-09-05 NOTE — Progress Notes (Signed)
Pre visit review using our clinic review tool, if applicable. No additional management support is needed unless otherwise documented below in the visit note. 

## 2015-09-05 NOTE — Progress Notes (Signed)
Healthcare at [facility] [address] [place], [place] [address] [phone]  Date:  09/05/2015   Name:  Jennifer Shea   DOB:  01-01-1976   MRN:  [id number]  PCP:  Eustaquio Boyden, MD    Chief Complaint: Ear Pain   History of Present Illness:  Jennifer Shea is a 40 y.o. very pleasant female patient who presents with the following:  Here today with illness.  She was here on Monday (today is Thursday) and was dx with likely flu, was given tamiflu.  She started feeling better and went back to work today and noted a strange noise in her ears- she feels like there is a bucked over her head. The sx seem present in both ears but worse on the left. She notes a whooshing feeling in her ears and a "feeling like if you are in the mountains and need to pop your ears"  No fever Her left ear is uncomfortable but not painful  LMP 3/6 She is a Production designer, theatre/television/film of a school cafeteria She notes that she has occasionally had left facial numbness for the last 15 years.  This has been fully evaluated with imaging and no cause was found.  She feels like she might be having some of this again but is not concerned and declines any imaging today  Patient Active Problem List   Diagnosis Date Noted  . Flu-like symptoms 09/02/2015  . URINARY INCONTINENCE, STRESS, FEMALE 06/27/2008    No past medical history on file.  No past surgical history on file.  Social History  Substance Use Topics  . Smoking status: Never Smoker   . Smokeless tobacco: None  . Alcohol Use: None    Family History  Problem Relation Age of Onset  . Hypertension Father   . Cancer Maternal Grandfather     No Known Allergies  Medication list has been reviewed and updated.  Current Outpatient Prescriptions on File Prior to Visit  Medication Sig Dispense Refill  . benzonatate (TESSALON) 100 MG capsule Take 1 capsule (100 mg total) by mouth 3 (three) times daily as needed for cough.  30 capsule 0  . oseltamivir (TAMIFLU) 75 MG capsule Take 1 capsule (75 mg total) by mouth 2 (two) times daily. 10 capsule 0   No current facility-administered medications on file prior to visit.    Review of Systems:  As per HPI- otherwise negative.   Physical Examination: Filed Vitals:   09/05/15 1559  BP: 120/70  Pulse: 91  Temp: 97.9 F (36.6 C)   Filed Vitals:   09/05/15 1559  Height:  (1.6 m)  Weight: 137 lb 9.6 oz (62.415 kg)   Body mass index is 24.38 kg/(m^2). Ideal Body Weight: Weight in (lb) to have BMI = 25: 140.8  GEN: WDWN, NAD, Non-toxic, A & O x 3, normal weight, looks well HEENT: Atraumatic, Normocephalic. Neck supple. No masses, No LAD.  Left TM and ear canal wnl.  Cerumen impaction on the RIGHT PEERL, EOMI, oropharynx wnl Ears and Nose: No external deformity. CV: RRR, No M/G/R. No JVD. No thrill. No extra heart sounds. PULM: CTA B, no wheezes, crackles, rhonchi. No retractions. No resp. distress. No accessory muscle use. EXTR: No c/c/e NEURO Normal gait. Normal strength, sensation and DTR of all limbs, negative romberg, normal facial motion and strength  PSYCH: Normally interactive. Conversant. Not depressed or anxious appearing.  Calm demeanor.  Irrigated RIGHT ear and removed  wax.  She did feel somewhat better but sx did not resolve entirely.  Left ear normal after irrigation  Assessment and Plan: ETD (eustachian tube dysfunction), left - Plan: predniSONE (DELTASONE) 20 MG tablet  Here today with complaint of her ears feeling congested and whooshing sound in ears.  She was dx with flu 4 days ago.  Today noted to have a right sided cerumen impaction.  However it seems that her sx are due to ERD She is using OTC medications without relief rx for oral prednisone and suggested OTC sudafed.   She will let me know if not better - Sooner if worse.  Meds ordered this encounter  Medications  . predniSONE (DELTASONE) 20 MG tablet    Sig: Take 2 pills a  day for 3 days    Dispense:  6 tablet    Refill:  0       Signed Abbe AmsterdamJessica Copland, MD

## 2016-02-03 ENCOUNTER — Encounter: Payer: Self-pay | Admitting: Internal Medicine

## 2016-02-03 ENCOUNTER — Ambulatory Visit (INDEPENDENT_AMBULATORY_CARE_PROVIDER_SITE_OTHER): Payer: BC Managed Care – PPO | Admitting: Internal Medicine

## 2016-02-03 DIAGNOSIS — N3000 Acute cystitis without hematuria: Secondary | ICD-10-CM | POA: Diagnosis not present

## 2016-02-03 HISTORY — DX: Acute cystitis without hematuria: N30.00

## 2016-02-03 LAB — POC URINALSYSI DIPSTICK (AUTOMATED)
Bilirubin, UA: NEGATIVE
Blood, UA: NEGATIVE
GLUCOSE UA: NEGATIVE
Ketones, UA: NEGATIVE
NITRITE UA: NEGATIVE
Protein, UA: NEGATIVE
Spec Grav, UA: >=1.03
UROBILINOGEN UA: 4
pH, UA: 5.5

## 2016-02-03 MED ORDER — SULFAMETHOXAZOLE-TRIMETHOPRIM 800-160 MG PO TABS: 1.0000 | ORAL_TABLET | Freq: Two times a day (BID) | ORAL | 0 refills | Status: DC

## 2016-02-03 NOTE — Progress Notes (Signed)
Pre visit review using our clinic review tool, if applicable. No additional management support is needed unless otherwise documented below in the visit note. 

## 2016-02-03 NOTE — Progress Notes (Signed)
   Subjective:    Patient ID: Jennifer Shea, female    DOB: 12/27/1975, 40 y.o.   MRN: [id number]  HPI Here due to urinary symptoms  Having urinary frequency Gets "like a shock sensation" when she voids--no clear dysuria Stated 2.5 weeks ago Not as bad today Pain is not as bad Increased frequency at night Some incontinence--not really new (post partum and goes way back). Will have problems even walking now  No fever No chills or sweats  No obvious interaction with sex or periods Thinks soft drinks might trigger  No longer taking cranberry pills  No current outpatient prescriptions on file prior to visit.   No current facility-administered medications on file prior to visit.     No Known Allergies  No past medical history on file.  No past surgical history on file.  Family History  Problem Relation Age of Onset  . Hypertension Father   . Cancer Maternal Grandfather     Social History   Social History  . Marital status: Married    Spouse name: N/A  . Number of children: 2  . Years of education: N/A   Occupational History  . Not on file.   Social History Main Topics  . Smoking status: Never Smoker  . Smokeless tobacco: Not on file  . Alcohol use Not on file  . Drug use: Unknown  . Sexual activity: Not on file   Other Topics Concern  . Not on file   Social History Narrative  . No narrative on file   Review of Systems No nausea or vomiting Appetite is fine No rash    Objective:   Physical Exam  Constitutional: She appears well-developed and well-nourished. No distress.  Abdominal: Soft. She exhibits no distension. There is no rebound and no guarding.  Mild suprapubic tenderness  Musculoskeletal:  No CVA tenderness          Assessment & Plan:

## 2016-02-03 NOTE — Addendum Note (Signed)
Addended by: Eual FinesBRIDGES, SHANNON P on: 02/03/2016 04:26 PM   Modules accepted: Orders

## 2016-02-03 NOTE — Patient Instructions (Signed)
Please start the antibiotic today. If your symptoms are gone tomorrow, you can stop the antibiotic after 3 days.

## 2016-02-03 NOTE — Assessment & Plan Note (Signed)
Will treat with septra 3 days probably enough Can use cranberry pills again--but not sure she needs to at this point

## 2016-02-05 ENCOUNTER — Telehealth: Payer: Self-pay

## 2016-02-05 NOTE — Telephone Encounter (Signed)
Spoke to patient. She says she is doing much better. She is drinking plenty of water.

## 2016-02-05 NOTE — Telephone Encounter (Signed)
-----   Message from Karie Schwalbeichard I Letvak, MD sent at 02/04/2016  8:04 AM EDT ----- See OV note

## 2016-02-27 ENCOUNTER — Ambulatory Visit: Payer: BC Managed Care – PPO | Admitting: Family Medicine

## 2016-05-21 ENCOUNTER — Encounter: Payer: Self-pay | Admitting: Family Medicine

## 2016-05-21 ENCOUNTER — Ambulatory Visit (INDEPENDENT_AMBULATORY_CARE_PROVIDER_SITE_OTHER): Payer: BC Managed Care – PPO | Admitting: Family Medicine

## 2016-05-21 ENCOUNTER — Ambulatory Visit: Payer: BC Managed Care – PPO | Admitting: Family Medicine

## 2016-05-21 VITALS — BP 114/70 | HR 88 | Temp 98.3°F | Wt 141.8 lb

## 2016-05-21 DIAGNOSIS — R21 Rash and other nonspecific skin eruption: Secondary | ICD-10-CM | POA: Diagnosis not present

## 2016-05-21 DIAGNOSIS — Z23 Encounter for immunization: Secondary | ICD-10-CM

## 2016-05-21 NOTE — Progress Notes (Signed)
   Subjective:    Patient ID: Jennifer Shea, female    DOB: 09-Apr-1976, 40 y.o.   MRN: [id number]  HPI This is a pleasant 40 yo female who presents today with rash under left eye for 6 months. Now starting to spread to right eye, but not as involved. Tried AD ointment and cortisone both of which burned.  Also has ongoing rash on left breast. Waxes and wanes. Has used cortisone with temporary relief of redness but then causes severe dryness and itching. Both worse with hot water/shower.  Has dryness and cracking of skin with detergent used at work.  No asthma or eczema history past or present.  No new soaps, lotions, detergent, make up.   No past medical history on file. No past surgical history on file. Family History  Problem Relation Age of Onset  . Hypertension Father   . Cancer Maternal Grandfather    Social History  Substance Use Topics  . Smoking status: Never Smoker  . Smokeless tobacco: Not on file  . Alcohol use Not on file     Review of Systems Per HPI    Objective:   Physical Exam  Constitutional: She is oriented to person, place, and time. She appears well-developed and well-nourished. No distress.  Eyes: Conjunctivae are normal. Right eye exhibits no discharge. Left eye exhibits no discharge.  Cardiovascular: Normal rate.   Pulmonary/Chest: Effort normal.  Neurological: She is alert and oriented to person, place, and time.  Skin: Skin is warm and dry. Rash noted. She is not diaphoretic.  Area under left eye with area of thickening and flaking.  Left lateral breast with few scattered, erythematous papules.  Hands very dry and scaly.   Vitals reviewed.     BP 114/70   Pulse 88   Temp 98.3 F (36.8 C) (Oral)   Wt 141 lb 12 oz (64.3 kg)   LMP 05/04/2016   SpO2 99%   BMI 25.11 kg/m  Wt Readings from Last 3 Encounters:  05/21/16 141 lb 12 oz (64.3 kg)  02/03/16 142 lb (64.4 kg)  09/05/15 137 lb 9.6 oz (62.4 kg)       Assessment & Plan:  1. Rash of  face - not sure if this seborrheic in nature, while awaiting derm appointment, can try anti-dandruff shampoo gently used to cleanse area - Ambulatory referral to Dermatology  2. Rash and nonspecific skin eruption - under breast, has not responded to topical steroid (OTC) which has caused burning, have instructed her to apply thick moisturizing cream like Eucerin and avoid prolonged exposure to hot water. - Ambulatory referral to Dermatology  3. Need for influenza vaccination - Flu Vaccine QUAD 36+ mos PF IM (Fluarix & Fluzone Quad PF)   Olean Reeeborah Gessner, FNP-BC  [facility], [facility]  05/24/2016 3:33 PM

## 2016-05-21 NOTE — Progress Notes (Signed)
Pre visit review using our clinic review tool, if applicable. No additional management support is needed unless otherwise documented below in the visit note. 

## 2016-05-21 NOTE — Patient Instructions (Signed)
Please get some dandruff shampoo and gently use to wash eyelids once a day Use Eucerin type lotion on breast and hands I have put in an order for a dermatologist, please see Shirlee LimerickMarion on your way out.

## 2016-10-08 ENCOUNTER — Encounter: Payer: Self-pay | Admitting: *Deleted

## 2016-10-08 ENCOUNTER — Ambulatory Visit (INDEPENDENT_AMBULATORY_CARE_PROVIDER_SITE_OTHER): Payer: BC Managed Care – PPO | Admitting: Family Medicine

## 2016-10-08 ENCOUNTER — Encounter: Payer: Self-pay | Admitting: Family Medicine

## 2016-10-08 ENCOUNTER — Encounter (INDEPENDENT_AMBULATORY_CARE_PROVIDER_SITE_OTHER): Payer: Self-pay

## 2016-10-08 VITALS — BP 122/70 | HR 73 | Temp 98.5°F | Wt 147.5 lb

## 2016-10-08 DIAGNOSIS — M25511 Pain in right shoulder: Secondary | ICD-10-CM

## 2016-10-08 MED ORDER — TRAMADOL HCL 50 MG PO TABS: 50.0000 mg | ORAL_TABLET | Freq: Two times a day (BID) | ORAL | 0 refills | Status: DC | PRN

## 2016-10-08 MED ORDER — PREDNISONE 20 MG PO TABS: ORAL_TABLET | ORAL | 0 refills | Status: DC

## 2016-10-08 NOTE — Progress Notes (Signed)
Pre visit review using our clinic review tool, if applicable. No additional management support is needed unless otherwise documented below in the visit note. 

## 2016-10-08 NOTE — Assessment & Plan Note (Signed)
Anticipate R shoulder bursitis as well as possible supraspinatus tendonitis. Less likely cervical radiculopathy. Treat with prednisone course (pt has tolerated well in the past) and with tramadol PRN. After prednisone may return to ibuprofen use.  Further supportive care reviewed - ice to shoulder, shoulder bursitis exercises from [facility] pt advisor provided today.  Light duty for 2 wks at work - note provided today.  If not improving, pt to return for steroid injection

## 2016-10-08 NOTE — Progress Notes (Signed)
   BP 122/70   Pulse 73   Temp 98.5 F (36.9 C) (Oral)   Wt 147 lb 8 oz (66.9 kg)   LMP 09/13/2016   SpO2 98%   BMI 26.13 kg/m    CC: neck, shoulder pain R Subjective:    Patient ID: Jennifer Shea, female    DOB: Oct 14, 1975, 41 y.o.   MRN: [id number]  HPI: Jennifer Shea is a 41 y.o. female presenting on 10/08/2016 for Neck Pain and Shoulder Pain (right)   Several week h/o R shoulder and neck pain. Initially though related to new pillow. Acutely worse yesterday and today. Having trouble getting ready in the morning putting up hair, even mouse work causing pain. Some numbness down arm as well. Describes posterior shoulder pain with radiation down arm. Feels constant tightness.   Tried tylenol, ibuprofen with some improvement. Ice also helps some.   Denies inciting falls or injuries. No fevers, redness, swelling or warmth of joint.  No increased exertion or repetitive movements outside of normal.  Teacher at school.   Relevant past medical, surgical, family and social history reviewed and updated as indicated. Interim medical history since our last visit reviewed. Allergies and medications reviewed and updated. No outpatient prescriptions prior to visit.   No facility-administered medications prior to visit.      Per HPI unless specifically indicated in ROS section below Review of Systems     Objective:    BP 122/70   Pulse 73   Temp 98.5 F (36.9 C) (Oral)   Wt 147 lb 8 oz (66.9 kg)   LMP 09/13/2016   SpO2 98%   BMI 26.13 kg/m   Wt Readings from Last 3 Encounters:  10/08/16 147 lb 8 oz (66.9 kg)  05/21/16 141 lb 12 oz (64.3 kg)  02/03/16 142 lb (64.4 kg)    Physical Exam  Constitutional: She is oriented to person, place, and time. She appears well-developed and well-nourished. No distress.  Musculoskeletal: She exhibits no edema.  No significant midline spine tenderness FROM at spine but with discomfort L shoulder WNL R Shoulder exam: No deformity of shoulders  on inspection. tender with palpation anterior and posterior shoulder FROM in abduction and forward flexion although with discomfort. No pain or weakness with testing SITS in ext/int rotation. + pain with empty can sign. Neg Speed test. No impingement. No pain with crossover test. No pain with rotation of humeral head in GH joint.   Neurological: She is alert and oriented to person, place, and time.  Neg spurling Sensation intact BUE 5/5 strength BUE  Skin: Skin is warm and dry. No rash noted.  Psychiatric: She has a normal mood and affect.  Nursing note and vitals reviewed.     Assessment & Plan:   Problem List Items Addressed This Visit    Right shoulder pain    Anticipate R shoulder bursitis as well as possible supraspinatus tendonitis. Less likely cervical radiculopathy. Treat with prednisone course (pt has tolerated well in the past) and with tramadol PRN. After prednisone may return to ibuprofen use.  Further supportive care reviewed - ice to shoulder, shoulder bursitis exercises from [facility] pt advisor provided today.  Light duty for 2 wks at work - note provided today.  If not improving, pt to return for steroid injection          Follow up plan: No Follow-up on file.  Eustaquio Boyden, MD

## 2016-10-08 NOTE — Patient Instructions (Signed)
I think you have right shoulder bursitis Treat with continued ice, prednisone course then return to ibuprofen  with meals. May use tramadol for breakthrough pain - caution it can make you sleepy. Do exercises provided today. Light duty at work for 2 weeks (no repetitive arm/shoulder movements, no lifting >10 lbs).  If not improving with treatment come in for steroid shot into the joint.

## 2017-04-16 ENCOUNTER — Ambulatory Visit (INDEPENDENT_AMBULATORY_CARE_PROVIDER_SITE_OTHER): Payer: BC Managed Care – PPO

## 2017-04-16 DIAGNOSIS — Z23 Encounter for immunization: Secondary | ICD-10-CM | POA: Diagnosis not present

## 2017-04-27 ENCOUNTER — Encounter (HOSPITAL_COMMUNITY): Payer: Self-pay | Admitting: Emergency Medicine

## 2017-04-27 ENCOUNTER — Ambulatory Visit (HOSPITAL_COMMUNITY)
Admission: EM | Admit: 2017-04-27 | Discharge: 2017-04-27 | Disposition: A | Discharge status: Home / Self Care | Payer: Worker's Compensation | Attending: Internal Medicine | Admitting: Internal Medicine

## 2017-04-27 DIAGNOSIS — T148XXA Other injury of unspecified body region, initial encounter: Secondary | ICD-10-CM

## 2017-04-27 MED ORDER — IBUPROFEN 800 MG PO TABS
800.0000 mg | ORAL_TABLET | Freq: Three times a day (TID) | ORAL | 0 refills | Status: DC
Start: 2017-04-27 — End: 2017-09-16

## 2017-04-27 MED ORDER — CYCLOBENZAPRINE HCL 10 MG PO TABS: 10.0000 mg | ORAL_TABLET | Freq: Two times a day (BID) | ORAL | 0 refills | Status: DC | PRN

## 2017-04-27 NOTE — ED Provider Notes (Signed)
[facility]    CSN: [id number] Arrival date & time: 04/27/17  1657     History   Chief Complaint Chief Complaint  Patient presents with  . Back Pain    HPI Jennifer Shea is a 41 y.o. female.   Presents today for acute right lower back pain onset this morning after twisting her back while at work trying to pulled out a rack out of the walk in freezer. Pain is currently 6/10. Have been taking tylenol today for pain relief. Pain is starting to radiate down the hip but no numbness or tingling sensation. Pain is described as sore. Has full ROM.         History reviewed. No pertinent past medical history.  Patient Active Problem List   Diagnosis Date Noted  . Right shoulder pain 10/08/2016  . URINARY INCONTINENCE, STRESS, FEMALE 06/27/2008    History reviewed. No pertinent surgical history.  OB History    No data available       Home Medications    Prior to Admission medications   Medication Sig Start Date End Date Taking? Authorizing Provider  cyclobenzaprine (FLEXERIL) 10 MG tablet Take 1 tablet (10 mg total) 2 (two) times daily as needed by mouth for muscle spasms. 04/27/17   Lucia EstelleZheng, Estera Ozier, NP  ibuprofen (ADVIL,MOTRIN) 800 MG tablet Take 1 tablet (800 mg total) 3 (three) times daily by mouth. 04/27/17   Lucia EstelleZheng, Jairo Bellew, NP  predniSONE (DELTASONE) 20 MG tablet Take two tablets daily for 3 days followed by one tablet daily for 4 days 10/08/16   Eustaquio BoydenGutierrez, Javier, MD  traMADol (ULTRAM) 50 MG tablet Take 1 tablet (50 mg total) by mouth 2 (two) times daily as needed for moderate pain (sedation precautions). 10/08/16   Eustaquio BoydenGutierrez, Javier, MD    Family History Family History  Problem Relation Age of Onset  . Hypertension Father   . Cancer Maternal Grandfather     Social History Social History   Tobacco Use  . Smoking status: Never Smoker  . Smokeless tobacco: Never Used  Substance Use Topics  . Alcohol use: Not on file  . Drug use: Not on file      Allergies   Patient has no known allergies.   Review of Systems Review of Systems  Constitutional:       See HPI     Physical Exam Triage Vital Signs ED Triage Vitals  Enc Vitals Group     BP 04/27/17 1714 121/85     Pulse Rate 04/27/17 1714 86     Resp 04/27/17 1714 18     Temp 04/27/17 1714 98.6 F (37 C)     Temp Source 04/27/17 1714 Oral     SpO2 04/27/17 1714 100 %     Weight --      Height --      Head Circumference --      Peak Flow --      Pain Score 04/27/17 1715 5     Pain Loc --      Pain Edu? --      Excl. in GC? --    No data found.  Updated Vital Signs BP 121/85 (BP Location: Left Arm)   Pulse 86   Temp 98.6 F (37 C) (Oral)   Resp 18   SpO2 100%   Visual Acuity Right Eye Distance:   Left Eye Distance:   Bilateral Distance:    Right Eye Near:   Left Eye Near:  Bilateral Near:     Physical Exam  Constitutional: She is oriented to person, place, and time. She appears well-developed and well-nourished.  Cardiovascular: Normal rate, regular rhythm and normal heart sounds.  Pulmonary/Chest: Effort normal and breath sounds normal. She has no wheezes.  Musculoskeletal: Normal range of motion.       Arms: Has full spine ROM with no tenderness on palpation. Is sore to palpate over the right lower back. Gait normal. Good coordination. Strength intact.   Neurological: She is alert and oriented to person, place, and time.  Nursing note and vitals reviewed.    UC Treatments / Results  Labs (all labs ordered are listed, but only abnormal results are displayed) Labs Reviewed - No data to display  EKG  EKG Interpretation None       Radiology No results found.  Procedures Procedures (including critical care time)  Medications Ordered in UC Medications - No data to display   Initial Impression / Assessment and Plan / UC Course  I have reviewed the triage vital signs and the nursing notes.  Pertinent labs & imaging results  that were available during my care of the patient were reviewed by me and considered in my medical decision making (see chart for details).   Final Clinical Impressions(s) / UC Diagnoses   Final diagnoses:  Muscle strain   Prescriptions given (see above). Reviewed directions for usage and side effects. Patient states understanding and will call with questions or problems. Patient instructed to call or follow up with his/her primary care doctor if failure to improve or change in symptoms. Discharge instruction given.   ED Discharge Orders        Ordered    cyclobenzaprine (FLEXERIL) 10 MG tablet  2 times daily PRN     04/27/17 1845    ibuprofen (ADVIL,MOTRIN) 800 MG tablet  3 times daily     04/27/17 1845     Controlled Substance Prescriptions [place] Controlled Substance Registry consulted? Not Applicable   Lucia EstelleZheng, Larnie Heart, NP 04/27/17 [phone]

## 2017-04-27 NOTE — ED Triage Notes (Signed)
Pt sts lower back pain after twisting while working today

## 2017-09-16 ENCOUNTER — Ambulatory Visit: Payer: BC Managed Care – PPO | Admitting: Family Medicine

## 2017-09-16 ENCOUNTER — Encounter: Payer: Self-pay | Admitting: Family Medicine

## 2017-09-16 VITALS — BP 116/70 | HR 89 | Temp 98.6°F | Wt 142.0 lb

## 2017-09-16 DIAGNOSIS — N3000 Acute cystitis without hematuria: Secondary | ICD-10-CM | POA: Diagnosis not present

## 2017-09-16 DIAGNOSIS — R160 Hepatomegaly, not elsewhere classified: Secondary | ICD-10-CM

## 2017-09-16 HISTORY — DX: Hepatomegaly, not elsewhere classified: R16.0

## 2017-09-16 LAB — POC URINALSYSI DIPSTICK (AUTOMATED)
Bilirubin, UA: NEGATIVE
Glucose, UA: NEGATIVE
KETONES UA: NEGATIVE
Nitrite, UA: NEGATIVE
PH UA: 7.5 (ref 5.0–8.0)
PROTEIN UA: NEGATIVE
RBC UA: NEGATIVE
SPEC GRAV UA: 1.01 (ref 1.010–1.025)
Urobilinogen, UA: 0.2 E.U./dL

## 2017-09-16 MED ORDER — CEPHALEXIN 500 MG PO CAPS: 500.0000 mg | ORAL_CAPSULE | Freq: Two times a day (BID) | ORAL | 0 refills | Status: DC

## 2017-09-16 NOTE — Patient Instructions (Addendum)
Schedule well woman exam as you're due.  We will check abdominal ultrasound for possible enlarged liver.  You do have UTI - treat with keflex antibiotic for 7 days. Push fluids and rest. Avoid bladder irritants. Let us know if not improving with treatment.   Urinary Tract Infection, Adult A urinary tract infection (UTI) is an infection of any part of the urinary tract. The urinary tract includes the:  Kidneys.  Ureters.  Bladder.  Urethra.  These organs make, store, and get rid of pee (urine) in the body. Follow these instructions at home:  Take over-the-counter and prescription medicines only as told by your doctor.  If you were prescribed an antibiotic medicine, take it as told by your doctor. Do not stop taking the antibiotic even if you start to feel better.  Avoid the following drinks: ? Alcohol. ? Caffeine. ? Tea. ? Carbonated drinks.  Drink enough fluid to keep your pee clear or pale yellow.  Keep all follow-up visits as told by your doctor. This is important.  Make sure to: ? Empty your bladder often and completely. Do not to hold pee for long periods of time. ? Empty your bladder before and after sex. ? Wipe from front to back after a bowel movement if you are female. Use each tissue one time when you wipe. Contact a doctor if:  You have back pain.  You have a fever.  You feel sick to your stomach (nauseous).  You throw up (vomit).  Your symptoms do not get better after 3 days.  Your symptoms go away and then come back. Get help right away if:  You have very bad back pain.  You have very bad lower belly (abdominal) pain.  You are throwing up and cannot keep down any medicines or water. This information is not intended to replace advice given to you by your health care provider. Make sure you discuss any questions you have with your health care provider. Document Released: 11/25/2007 Document Revised: 11/14/2015 Document Reviewed: 04/29/2015 Elsevier  Interactive Patient Education  [employer]2018 Elsevier Inc.

## 2017-09-16 NOTE — Assessment & Plan Note (Signed)
New finding - tender on exam. Denies sxs of gallstones. Will check abd US to further evaluate this.

## 2017-09-16 NOTE — Addendum Note (Signed)
Addended by: Eustaquio BoydenGUTIERREZ, Kaleo Condrey on: 09/16/2017 11:55 AM   Modules accepted: Level of Service

## 2017-09-16 NOTE — Assessment & Plan Note (Addendum)
UA/micro consistent with UTI. Anticipate uncomplicated cystitis - treat with 7d keflex course. Supportive care reviewed. Update if not improving with treatment. Ok to use tylenol, urostat for symptom relief.  UCx not sent.

## 2017-09-16 NOTE — Progress Notes (Signed)
BP 116/70 (BP Location: Left Arm, Patient Position: Sitting, Cuff Size: Normal)   Pulse 89   Temp 98.6 F (37 C) (Oral)   Wt 142 lb (64.4 kg)   LMP 08/30/2017   SpO2 99%   BMI 25.15 kg/m    CC: UTI? Subjective:    Patient ID: Jennifer Shea, female    DOB: [birth year], 42 y.o.   MRN: [id number]  HPI: Jennifer Shea is a 42 y.o. female presenting on 09/16/2017 for Urinary Frequency (Started 09/07/17. ) and Abdominal Pain (Located in lower pubic region during urination. Has tried OTC urinary meds, helpful.)   1+ wk h/o urinary urgency, frequency and abdominal discomfort.  No recent UTI.   No fevers/chills, nausea, flank pain, hematuria.   Treating with uristat (phenazopyridine) with improvement.  She did have kidney stone while pregnant.   Last well woman exam - >15 yrs ago.   Relevant past medical, surgical, family and social history reviewed and updated as indicated. Interim medical history since our last visit reviewed. Allergies and medications reviewed and updated. Outpatient Medications Prior to Visit  Medication Sig Dispense Refill  . cyclobenzaprine (FLEXERIL) 10 MG tablet Take 1 tablet (10 mg total) 2 (two) times daily as needed by mouth for muscle spasms. 20 tablet 0  . ibuprofen (ADVIL,MOTRIN) 800 MG tablet Take 1 tablet (800 mg total) 3 (three) times daily by mouth. 21 tablet 0  . predniSONE (DELTASONE) 20 MG tablet Take two tablets daily for 3 days followed by one tablet daily for 4 days 10 tablet 0  . traMADol (ULTRAM) 50 MG tablet Take 1 tablet (50 mg total) by mouth 2 (two) times daily as needed for moderate pain (sedation precautions). 30 tablet 0   No facility-administered medications prior to visit.      Per HPI unless specifically indicated in ROS section below Review of Systems     Objective:    BP 116/70 (BP Location: Left Arm, Patient Position: Sitting, Cuff Size: Normal)   Pulse 89   Temp 98.6 F (37 C) (Oral)   Wt 142 lb (64.4 kg)   LMP  08/30/2017   SpO2 99%   BMI 25.15 kg/m   Wt Readings from Last 3 Encounters:  09/16/17 142 lb (64.4 kg)  10/08/16 147 lb 8 oz (66.9 kg)  05/21/16 141 lb 12 oz (64.3 kg)    Physical Exam  Constitutional: She appears well-developed. No distress.  HENT:  Mouth/Throat: Oropharynx is clear and moist. No oropharyngeal exudate.  Abdominal: Soft. Normal appearance and bowel sounds are normal. She exhibits no distension and no mass. There is hepatomegaly (possible, tender). There is tenderness in the right upper quadrant and suprapubic area. There is no rigidity, no rebound, no guarding, no CVA tenderness and negative Murphy's sign.  RUQ discomfort to palpation, suprapubic pressure  Musculoskeletal: She exhibits no edema.  Psychiatric: She has a normal mood and affect.  Nursing note and vitals reviewed.  Results for orders placed or performed in visit on 09/16/17  POCT Urinalysis Dipstick (Automated)  Result Value Ref Range   Color, UA yellow    Clarity, UA clear    Glucose, UA negative    Bilirubin, UA negative    Ketones, UA negative    Spec Grav, UA 1.010 1.010 - 1.025   Blood, UA negative    pH, UA 7.5 5.0 - 8.0   Protein, UA negative    Urobilinogen, UA 0.2 0.2 or 1.0 E.U./dL   Nitrite, UA negative  Leukocytes, UA Large (3+) (A) Negative   Micro: WBC 5-10 RBC 3 Bact 1+ Casts none Epi rare UCx not sent    Assessment & Plan:  Overdue for physical and well woman exam - advised she schedule this with us or return to [facility].  Problem List Items Addressed This Visit    Acute cystitis without hematuria - Primary    UA/micro consistent with UTI. Anticipate uncomplicated cystitis - treat with 7d keflex course. Supportive care reviewed. Update if not improving with treatment. Ok to use tylenol, urostat for symptom relief.  UCx not sent.       Relevant Orders   POCT Urinalysis Dipstick (Automated) (Completed)   Hepatomegaly    New finding - tender on exam. Denies sxs  of gallstones. Will check abd US to further evaluate this.       Relevant Orders   US Abdomen Complete       Meds ordered this encounter  Medications  . cephALEXin (KEFLEX) 500 MG capsule    Sig: Take 1 capsule (500 mg total) by mouth 2 (two) times daily.    Dispense:  14 capsule    Refill:  0   Orders Placed This Encounter  Procedures  . US Abdomen Complete    Standing Status:   Future    Standing Expiration Date:   11/17/2018    Order Specific Question:   Reason for Exam (SYMPTOM  OR DIAGNOSIS REQUIRED)    Answer:   possible enlarged liver on exam    Order Specific Question:   Preferred imaging location?    Answer:   [id number]  . POCT Urinalysis Dipstick (Automated)    Follow up plan: Return if symptoms worsen or fail to improve.  Eustaquio BoydenJavier Garreth Burnsworth, MD

## 2017-09-23 ENCOUNTER — Ambulatory Visit (HOSPITAL_COMMUNITY)
Admission: RE | Admit: 2017-09-23 | Discharge: 2017-09-23 | Disposition: A | Discharge status: Home / Self Care | Payer: BC Managed Care – PPO | Source: Ambulatory Visit | Attending: Family Medicine | Admitting: Family Medicine

## 2017-09-23 DIAGNOSIS — R160 Hepatomegaly, not elsewhere classified: Secondary | ICD-10-CM

## 2017-09-23 DIAGNOSIS — K7689 Other specified diseases of liver: Secondary | ICD-10-CM | POA: Insufficient documentation

## 2017-11-03 ENCOUNTER — Ambulatory Visit: Payer: BC Managed Care – PPO | Admitting: Family Medicine

## 2017-11-03 ENCOUNTER — Encounter: Payer: Self-pay | Admitting: Family Medicine

## 2017-11-03 VITALS — BP 120/68 | HR 102 | Temp 98.2°F | Ht 63.0 in | Wt 145.5 lb

## 2017-11-03 DIAGNOSIS — J0100 Acute maxillary sinusitis, unspecified: Secondary | ICD-10-CM | POA: Diagnosis not present

## 2017-11-03 DIAGNOSIS — J019 Acute sinusitis, unspecified: Secondary | ICD-10-CM | POA: Insufficient documentation

## 2017-11-03 HISTORY — DX: Acute sinusitis, unspecified: J01.90

## 2017-11-03 MED ORDER — AMOXICILLIN-POT CLAVULANATE 875-125 MG PO TABS: 1.0000 | ORAL_TABLET | Freq: Two times a day (BID) | ORAL | 0 refills | Status: AC

## 2017-11-03 MED ORDER — FLUTICASONE PROPIONATE 50 MCG/ACT NA SUSP: 2.0000 | Freq: Every day | NASAL | 1 refills | Status: DC

## 2017-11-03 NOTE — Progress Notes (Signed)
BP 120/68 (BP Location: Left Arm, Patient Position: Sitting, Cuff Size: Normal)   Pulse (!) 102   Temp 98.2 F (36.8 C) (Oral)   Ht  (1.6 m)   Wt 145 lb 8 oz (66 kg)   SpO2 100%   BMI 25.77 kg/m    CC: sinus congestion Subjective:    Patient ID: Jennifer Shea, female    DOB: June 22, 1976, 42 y.o.   MRN: [id number]  HPI: Jennifer Shea is a 42 y.o. female presenting on 11/03/2017 for Sinus Problem (C/o sinus pressure and nasal congestion. Sxs are worse in the morning. Started about 2 wks ago. Tried Dayquil and Mucinex. )   2-3 wk h/o sinus congestion worse when outdoors and at night. Sinus congestion, L sided maxillary sinus tenderness traveling into L ear, sore throat with PNdrainage. Itchy eyes, rhinorrhea.   Treating with dayquil, mucinex.  No fevers/chills, cough.  No sick contacts at home. Non smoker No h/o asthma.   Upcoming trip to beach on motorcycle.   Relevant past medical, surgical, family and social history reviewed and updated as indicated. Interim medical history since our last visit reviewed. Allergies and medications reviewed and updated. Outpatient Medications Prior to Visit  Medication Sig Dispense Refill  . cephALEXin (KEFLEX) 500 MG capsule Take 1 capsule (500 mg total) by mouth 2 (two) times daily. 14 capsule 0   No facility-administered medications prior to visit.      Per HPI unless specifically indicated in ROS section below Review of Systems     Objective:    BP 120/68 (BP Location: Left Arm, Patient Position: Sitting, Cuff Size: Normal)   Pulse (!) 102   Temp 98.2 F (36.8 C) (Oral)   Ht  (1.6 m)   Wt 145 lb 8 oz (66 kg)   SpO2 100%   BMI 25.77 kg/m   Wt Readings from Last 3 Encounters:  11/03/17 145 lb 8 oz (66 kg)  09/16/17 142 lb (64.4 kg)  10/08/16 147 lb 8 oz (66.9 kg)    Physical Exam  Constitutional: She appears well-developed and well-nourished. No distress.  HENT:  Head: Normocephalic and atraumatic.  Right Ear:  Hearing, tympanic membrane, external ear and ear canal normal.  Left Ear: Hearing, tympanic membrane, external ear and ear canal normal.  Nose: Rhinorrhea present. No mucosal edema. Right sinus exhibits no maxillary sinus tenderness and no frontal sinus tenderness. Left sinus exhibits maxillary sinus tenderness. Left sinus exhibits no frontal sinus tenderness.  Mouth/Throat: Uvula is midline, oropharynx is clear and moist and mucous membranes are normal. No oropharyngeal exudate, posterior oropharyngeal edema, posterior oropharyngeal erythema or tonsillar abscesses.  Some congestion behind TMs bilaterally, good light reflex Nasal mucosal congestion R>L  Eyes: Pupils are equal, round, and reactive to light. Conjunctivae and EOM are normal. No scleral icterus.  Neck: Normal range of motion. Neck supple.  Cardiovascular: Normal rate, regular rhythm, normal heart sounds and intact distal pulses.  No murmur heard. Pulmonary/Chest: Effort normal and breath sounds normal. No respiratory distress. She has no wheezes. She has no rales.  Lymphadenopathy:    She has no cervical adenopathy.  Skin: Skin is warm and dry. No rash noted.  Nursing note and vitals reviewed.     Assessment & Plan:   Problem List Items Addressed This Visit    Acute sinusitis - Primary    Exam/story consistent with L maxillary sinusitis, anticipate bacterial given duration/progression of symptoms. Treat with augmentin 7-10 d course. She also  has allergic component to her symptoms - start antihistamine and nasal steroid. Update if not improving with treatment. Pt agrees with plan.       Relevant Medications   amoxicillin-clavulanate (AUGMENTIN) 875-125 MG tablet   fluticasone (FLONASE) 50 MCG/ACT nasal spray       Meds ordered this encounter  Medications  . amoxicillin-clavulanate (AUGMENTIN) 875-125 MG tablet    Sig: Take 1 tablet by mouth 2 (two) times daily for 10 days.    Dispense:  20 tablet    Refill:  0  .  fluticasone (FLONASE) 50 MCG/ACT nasal spray    Sig: Place 2 sprays into both nostrils daily.    Dispense:  16 g    Refill:  1   No orders of the defined types were placed in this encounter.   Follow up plan: Return if symptoms worsen or fail to improve.  Eustaquio Boyden, MD

## 2017-11-03 NOTE — Patient Instructions (Signed)
Possible sinus infection, possible allergy contribution.  Treat with augmentin course, also start taking daily claritin or zyrtec and flonase nasal steroid.  Push fluids and rest.  Continue mucinex with plenty of water to help mobilize mucous.  Let us know if fever >101, worsening productive cough or not improving with treatment.  Enjoy beach trip!

## 2017-11-03 NOTE — Assessment & Plan Note (Signed)
Exam/story consistent with L maxillary sinusitis, anticipate bacterial given duration/progression of symptoms. Treat with augmentin 7-10 d course. She also has allergic component to her symptoms - start antihistamine and nasal steroid. Update if not improving with treatment. Pt agrees with plan.

## 2020-03-20 IMAGING — US US ABDOMEN COMPLETE
1 series · 13 of 25 positions shown · non-contrast
Comparison: None.

CLINICAL DATA: Enlarged liver on physical exam

EXAM:
ABDOMEN ULTRASOUND COMPLETE

[Series 1: us abdomen complete · 13 of 108 slices shown]
[im 1/108]
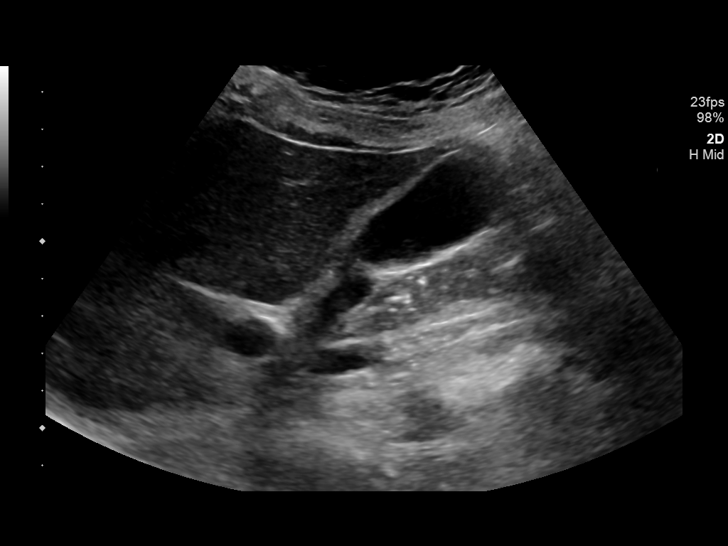
[im 9/108]
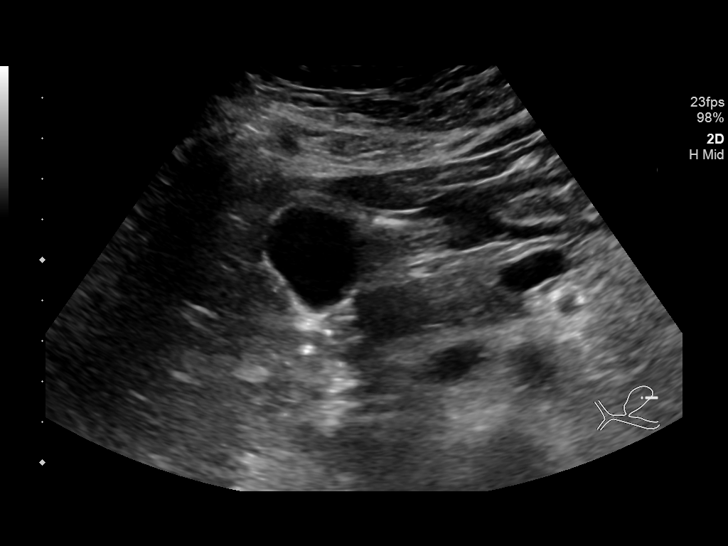
[im 18/108]
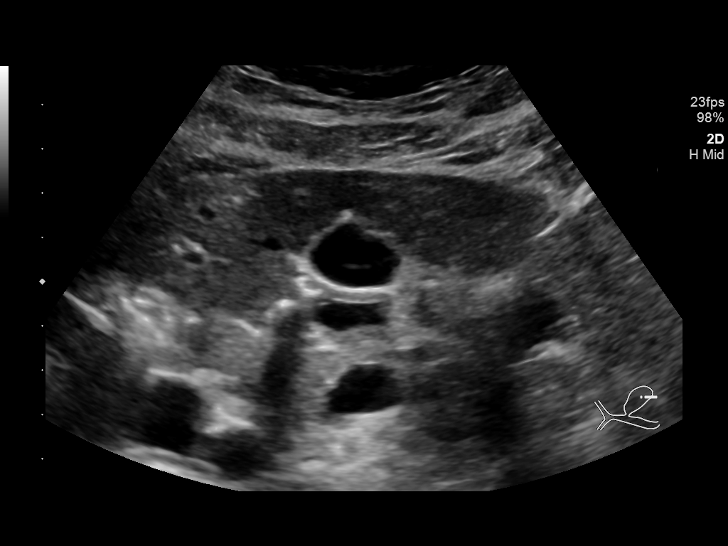
[im 27/108]
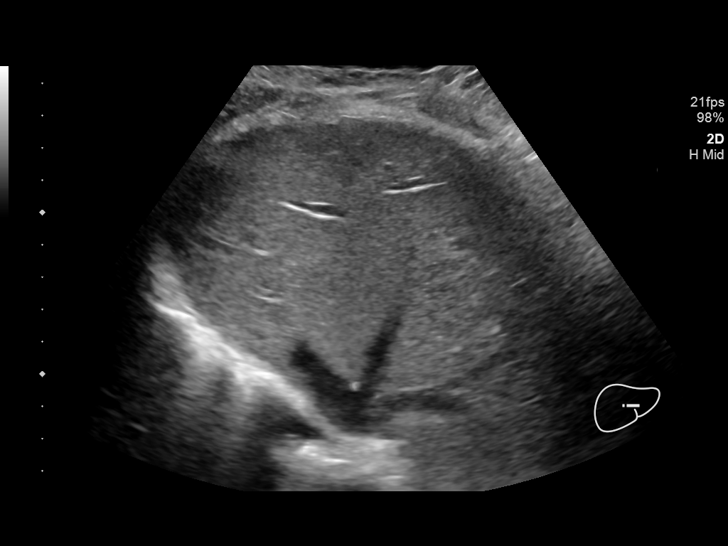
[im 36/108]
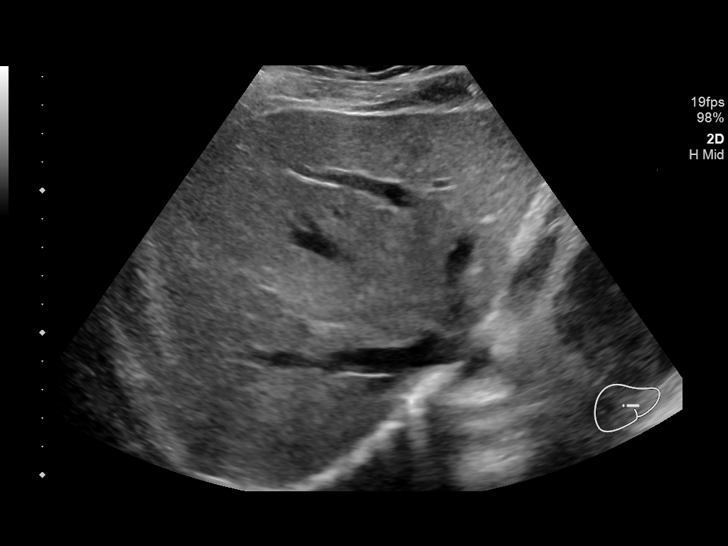
[im 45/108]
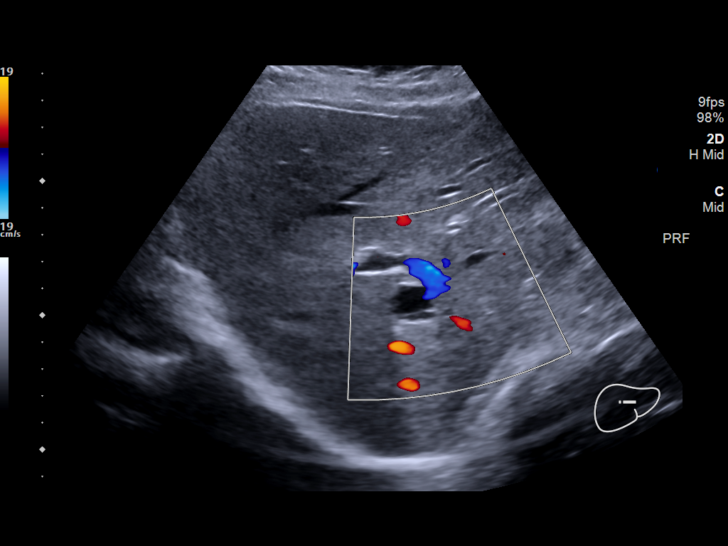
[im 54/108]
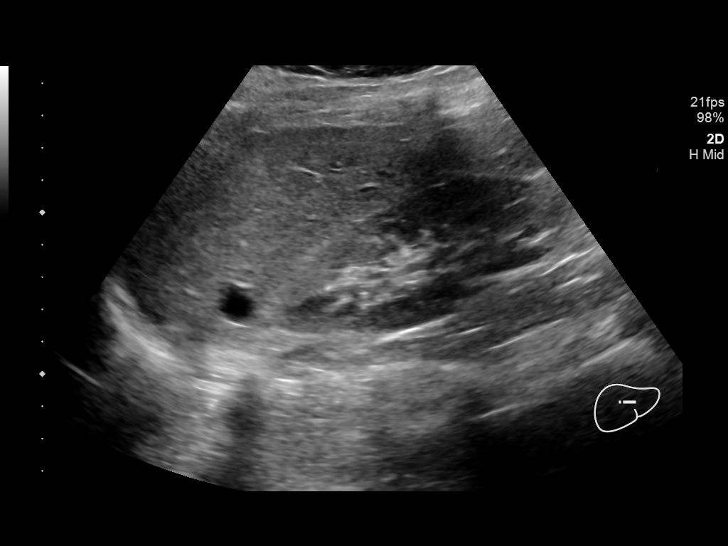
[im 63/108]
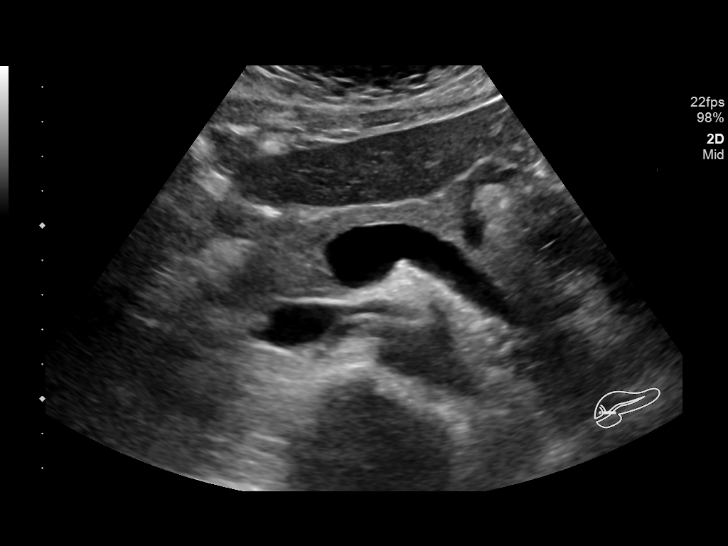
[im 72/108]
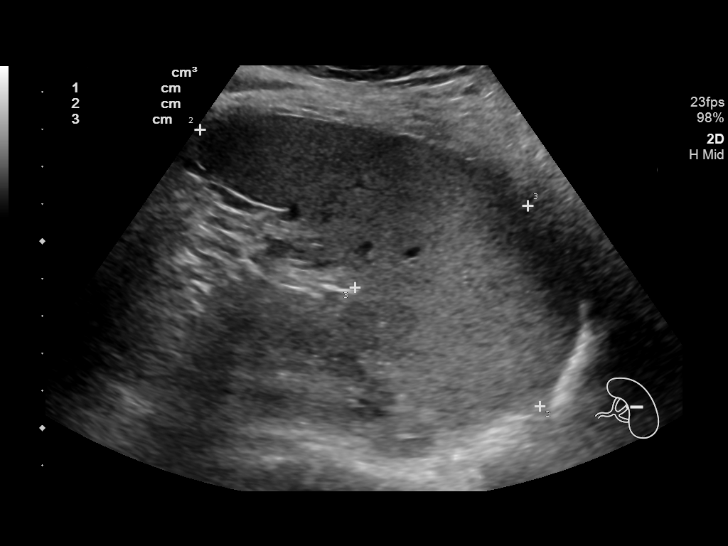
[im 81/108]
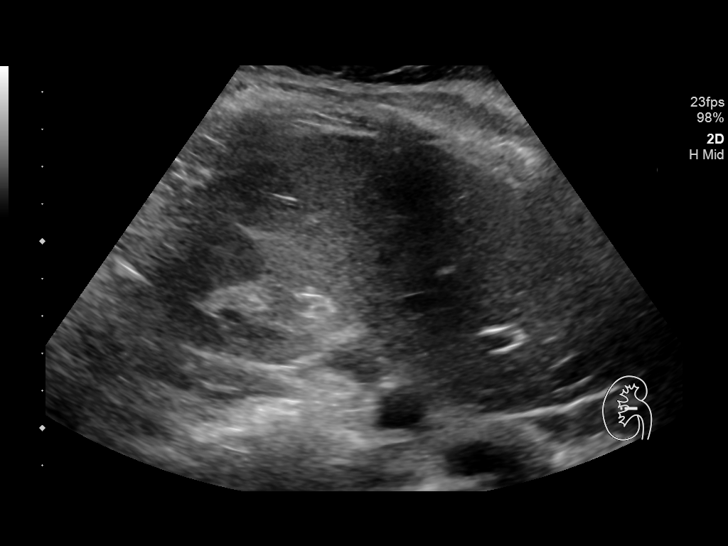
[im 90/108]
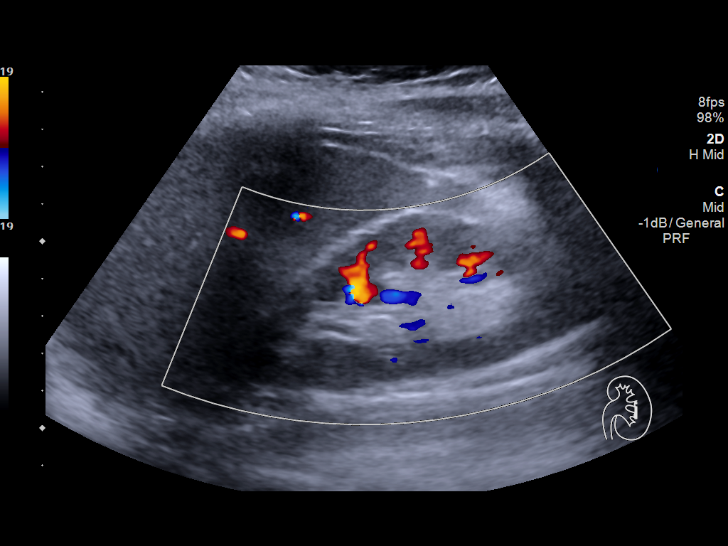
[im 99/108]
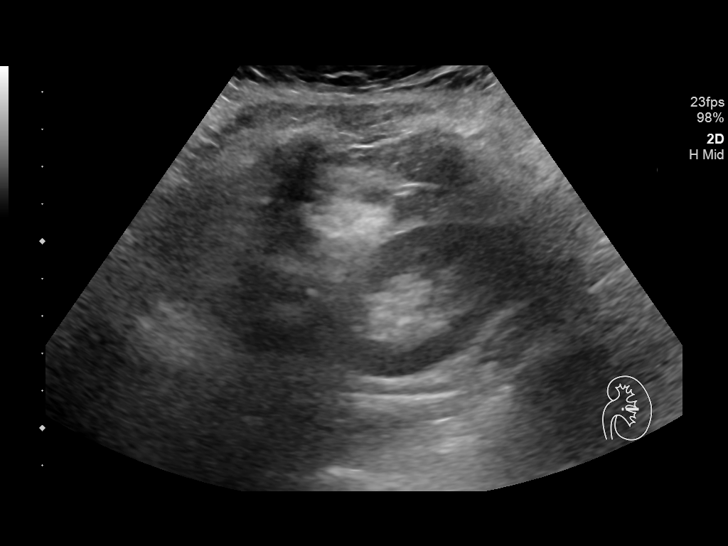
[im 108/108]
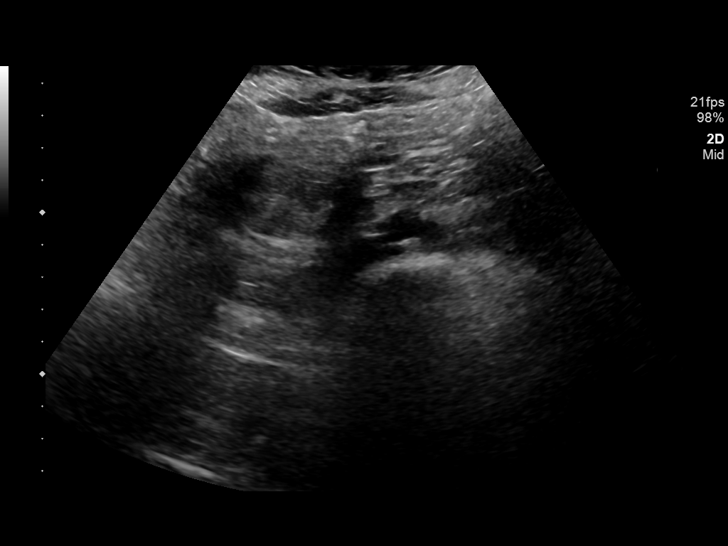

[13 of 25 positions shown; findings below may reference images not displayed]

FINDINGS: Gallbladder: Gallbladder is visualized and no gallstones are noted.
There is no pain over the gallbladder with compression.

Common bile duct: Diameter: The common bile duct is normal measuring
2.3 mm in diameter.

Liver: The parenchyma of the liver is normal in echogenicity. Small
cysts are present in the right lobe the larger of 1.6 cm. By
ultrasound, the liver does not appear to be particularly enlarged.
Portal vein is patent on color Doppler imaging with normal direction
of blood flow towards the liver.

IVC: No abnormality visualized.

Pancreas: Pancreas is moderately well seen with no abnormality
noted.

Spleen: The spleen is slightly prominent measuring 11.3 cm.

Right Kidney: Length: 10.7 cm.. No hydronephrosis is seen. The
echogenicity of the renal parenchyma is within normal limits.

Left Kidney: Length: 10.4 cm..  No hydronephrosis is noted.

Abdominal aorta: The abdominal aorta is normal in caliber.

Other findings: None.
IMPRESSION: 1. No gallstones.  No ductal dilatation.
2. No significant abnormality of the liver.  Small cyst present.
3. The liver does not appear to be particularly enlarged by
ultrasound.
4. Slightly prominent spleen.

## 2020-10-25 ENCOUNTER — Ambulatory Visit: Payer: BC Managed Care – PPO | Admitting: Registered Nurse

## 2020-10-30 ENCOUNTER — Encounter: Payer: Self-pay | Admitting: Family Medicine

## 2020-10-30 ENCOUNTER — Other Ambulatory Visit: Payer: Self-pay

## 2020-10-30 ENCOUNTER — Ambulatory Visit (INDEPENDENT_AMBULATORY_CARE_PROVIDER_SITE_OTHER): Payer: No Typology Code available for payment source | Admitting: Family Medicine

## 2020-10-30 VITALS — BP 136/82 | HR 86 | Temp 97.6°F | Ht 63.0 in | Wt 166.2 lb

## 2020-10-30 DIAGNOSIS — R600 Localized edema: Secondary | ICD-10-CM | POA: Diagnosis not present

## 2020-10-30 DIAGNOSIS — N3946 Mixed incontinence: Secondary | ICD-10-CM

## 2020-10-30 DIAGNOSIS — R229 Localized swelling, mass and lump, unspecified: Secondary | ICD-10-CM | POA: Diagnosis not present

## 2020-10-30 DIAGNOSIS — R350 Frequency of micturition: Secondary | ICD-10-CM | POA: Diagnosis not present

## 2020-10-30 HISTORY — DX: Localized edema: R60.0

## 2020-10-30 HISTORY — DX: Localized swelling, mass and lump, unspecified: R22.9

## 2020-10-30 LAB — MICROALBUMIN / CREATININE URINE RATIO
Creatinine,U: 96.3 mg/dL
Microalb Creat Ratio: 1.7 mg/g (ref 0.0–30.0)
Microalb, Ur: 1.7 mg/dL (ref 0.0–1.9)

## 2020-10-30 LAB — POC URINALSYSI DIPSTICK (AUTOMATED)
Bilirubin, UA: NEGATIVE
Glucose, UA: NEGATIVE
Ketones, UA: NEGATIVE
Nitrite, UA: NEGATIVE
Protein, UA: POSITIVE — AB
Spec Grav, UA: >=1.03 — AB (ref 1.010–1.025)
Urobilinogen, UA: 0.2 E.U./dL
pH, UA: 5.5 (ref 5.0–8.0)

## 2020-10-30 MED ORDER — SULFAMETHOXAZOLE-TRIMETHOPRIM 800-160 MG PO TABS: 1.0000 | ORAL_TABLET | Freq: Two times a day (BID) | ORAL | 0 refills | Status: DC

## 2020-10-30 NOTE — Patient Instructions (Addendum)
Possible UTI - treat with antibiotic course sent to pharmacy.  Urine culture sent.  We will also refer you to urologist for further evaluation.  For bumps throughout body - we will check ultrasound of right upper extremity.  Let us know if worsening or enlarging areas or if worsening urinary symptoms.

## 2020-10-30 NOTE — Addendum Note (Signed)
Addended by: Eustaquio Boyden on: 10/30/2020 10:21 AM   Modules accepted: Orders

## 2020-10-30 NOTE — Progress Notes (Addendum)
Patient ID: Jennifer Shea, female    DOB: 1976/02/22, 45 y.o.   MRN: [id number]  This visit was conducted in person.  BP 136/82   Pulse 86   Temp 97.6 F (36.4 C) (Temporal)   Ht 5\' 3"  (1.6 m)   Wt 166 lb 3 oz (75.4 kg)   LMP 10/10/2020   SpO2 99%   BMI 29.44 kg/m    CC: frequency, lumps on body Subjective:   HPI: Jennifer Shea is a 45 y.o. female presenting on 10/30/2020 for Urinary Frequency (C/o urinary frequency.  Started yrs ago, worsening.  Denies any other sxs. ) and Cyst (C/o knots all over body.  Noticed about 1 yr ago.  Areas are sensitive to touch. )   Last seen 10/2017.  Currently on prednisone course for poison ivy.   Ongoing bladder trouble since 2 pregnancies. Notes increased frequency, urgency, . Over the past 1 year this has progressed to both urge and stress incontience - to point of wearing bladder pad, unable to control.  Accidents of leaking even with walking. Notes urge incontinence as well as leaking with lifting something heavy. + accidents with coughing, sneezing, laughing. Having urinary accidents with relations. To the point of severely restricting fluid intake.  Acutely worse over the past 4-5 months.  No fevers/chills, dysuria, hematuria, flank pain, nausea/vomiting.  She takes cranberry pills with benefit. Finds dysuria develops when she stops this. Avoids soft drinks.  Limits caffeine in diet.  No bowel accidents, lower back pain, numbness or weakness of legs.  G2P2 both vaginal deliveries   Over the past year noticing knots throughout body. Bruised tender feeling to areas. No skin changes, but feels lumps. Denies inciting trauma/injury or falls.   By the way also noticing dependent ankle and foot swelling      Relevant past medical, surgical, family and social history reviewed and updated as indicated. Interim medical history since our last visit reviewed. Allergies and medications reviewed and updated. Outpatient Medications Prior to Visit   Medication Sig Dispense Refill  . predniSONE (DELTASONE) 20 MG tablet Take by mouth.    . fluticasone (FLONASE) 50 MCG/ACT nasal spray Place 2 sprays into both nostrils daily. 16 g 1   No facility-administered medications prior to visit.     Per HPI unless specifically indicated in ROS section below Review of Systems Objective:  BP 136/82   Pulse 86   Temp 97.6 F (36.4 C) (Temporal)   Ht 5\' 3"  (1.6 m)   Wt 166 lb 3 oz (75.4 kg)   LMP 10/10/2020   SpO2 99%   BMI 29.44 kg/m   Wt Readings from Last 3 Encounters:  10/30/20 166 lb 3 oz (75.4 kg)  11/03/17 145 lb 8 oz (66 kg)  09/16/17 142 lb (64.4 kg)      Physical Exam Vitals and nursing note reviewed.  Constitutional:      Appearance: Normal appearance. She is not ill-appearing.  Cardiovascular:     Rate and Rhythm: Normal rate and regular rhythm.     Pulses: Normal pulses.     Heart sounds: Normal heart sounds. No murmur heard.   Pulmonary:     Effort: Pulmonary effort is normal. No respiratory distress.     Breath sounds: Normal breath sounds. No wheezing, rhonchi or rales.  Abdominal:     General: Bowel sounds are normal. There is no distension.     Palpations: Abdomen is soft. There is no mass.  Tenderness: There is no abdominal tenderness. There is no right CVA tenderness, left CVA tenderness, guarding or rebound.     Hernia: No hernia is present.  Musculoskeletal:     Right lower leg: No edema.     Left lower leg: No edema.  Skin:    Comments: Tender small nodules present on skin throughout body - 2 to L lateral thigh, 1 to R posterior upper arm, possible one to left flank  Neurological:     Mental Status: She is alert.  Psychiatric:        Mood and Affect: Mood normal.        Behavior: Behavior normal.       Results for orders placed or performed in visit on 10/30/20  POCT Urinalysis Dipstick (Automated)  Result Value Ref Range   Color, UA light yellow    Clarity, UA clear    Glucose, UA  Negative Negative   Bilirubin, UA negative    Ketones, UA negative    Spec Grav, UA >=1.030 (A) 1.010 - 1.025   Blood, UA +/-    pH, UA 5.5 5.0 - 8.0   Protein, UA Positive (A) Negative   Urobilinogen, UA 0.2 0.2 or 1.0 E.U./dL   Nitrite, UA negative    Leukocytes, UA Moderate (2+) (A) Negative   Assessment & Plan:  This visit occurred during the SARS-CoV-2 public health emergency.  Safety protocols were in place, including screening questions prior to the visit, additional usage of staff PPE, and extensive cleaning of exam room while observing appropriate contact time as indicated for disinfecting solutions.   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 Will refer to urology for further evaluation and management, may need urodynamic studies.  UA today suspicious for infection - treat with bactrim DS course. Await UCx results.       Relevant Orders   Ambulatory referral to Urology   Skin lumps    Multiple skin lumps - ?scarred nodules throughout skin of extremities as well as left lateral side Further evaluate with soft tissue US.       Relevant Orders   US SOFT TISSUE RT UPPER EXTREMITY LTD (NON-VASCULAR)    Other Visit Diagnoses    Urinary frequency       Relevant Orders   POCT Urinalysis Dipstick (Automated) (Completed)   Urine Culture       Meds ordered this encounter  Medications  . sulfamethoxazole-trimethoprim (BACTRIM DS) 800-160 MG tablet    Sig: Take 1 tablet by mouth 2 (two) times daily.    Dispense:  6 tablet    Refill:  0   Orders Placed This Encounter  Procedures  . Urine Culture  . US SOFT TISSUE RT UPPER EXTREMITY LTD (NON-VASCULAR)    Standing Status:   Future    Standing Expiration Date:   10/30/2021    Order Specific Question:    Reason for Exam (SYMPTOM  OR DIAGNOSIS REQUIRED)    Answer:   tender skin lump to R posterior upper arm    Order Specific Question:   Preferred imaging location?    Answer:   [facility]  . Ambulatory referral to Urology    Referral Priority:   Routine    Referral Type:   Consultation    Referral Reason:  Specialty Services Required    Requested Specialty:   Urology    Number of Visits Requested:   1  . POCT Urinalysis Dipstick (Automated)    Patient Instructions  Possible UTI - treat with antibiotic course sent to pharmacy.  Urine culture sent.  We will also refer you to urologist for further evaluation.  For bumps throughout body - we will check ultrasound of right upper extremity.  Let us know if worsening or enlarging areas or if worsening urinary symptoms.   Follow up plan: Return if symptoms worsen or fail to improve.  Eustaquio Boyden, MD

## 2020-10-30 NOTE — Assessment & Plan Note (Signed)
Multiple skin lumps - ?scarred nodules throughout skin of extremities as well as left lateral side Further evaluate with soft tissue US.

## 2020-10-30 NOTE — Assessment & Plan Note (Signed)
Longstanding h/o stress incontinence More recently (months) describes worsening symptoms of mixed incontinence (stress, urge, overflow) that are pretty severe in nature. Will refer to urology for further evaluation and management, may need urodynamic studies.  UA today suspicious for infection - treat with bactrim DS course. Await UCx results.

## 2020-11-01 LAB — URINE CULTURE
MICRO NUMBER:: 11877246
SPECIMEN QUALITY:: ADEQUATE

## 2020-11-20 ENCOUNTER — Encounter: Payer: Self-pay | Admitting: Urology

## 2020-11-20 ENCOUNTER — Other Ambulatory Visit: Payer: Self-pay

## 2020-11-20 ENCOUNTER — Ambulatory Visit (INDEPENDENT_AMBULATORY_CARE_PROVIDER_SITE_OTHER): Payer: No Typology Code available for payment source | Admitting: Urology

## 2020-11-20 VITALS — BP 149/90 | HR 108 | Ht 62.0 in | Wt 165.0 lb

## 2020-11-20 DIAGNOSIS — N3281 Overactive bladder: Secondary | ICD-10-CM | POA: Diagnosis not present

## 2020-11-20 DIAGNOSIS — N3946 Mixed incontinence: Secondary | ICD-10-CM | POA: Diagnosis not present

## 2020-11-20 MED ORDER — OXYBUTYNIN CHLORIDE ER 10 MG PO TB24: 10.0000 mg | ORAL_TABLET | Freq: Every day | ORAL | 11 refills | Status: DC

## 2020-11-20 NOTE — Patient Instructions (Signed)
Overactive Bladder, Adult  Overactive bladder is a condition in which a person has a sudden and frequent need to urinate. A person might also leak urine if he or she cannot get to the bathroom fast enough (urinary incontinence). Sometimes, symptoms can interfere with work or social activities. What are the causes? Overactive bladder is associated with poor nerve signals between your bladder and your brain. Your bladder may get the signal to empty before it is full. You may also have very sensitive muscles that make your bladder squeeze too soon. This condition may also be caused by other factors, such as:  Medical conditions: ? Urinary tract infection. ? Infection of nearby tissues. ? Prostate enlargement. ? Bladder stones, inflammation, or tumors. ? Diabetes. ? Muscle or nerve weakness, especially from these conditions:  A spinal cord injury.  Stroke.  Multiple sclerosis.  Parkinson's disease.  Other causes: ? Surgery on the uterus or urethra. ? Drinking too much caffeine or alcohol. ? Certain medicines, especially those that eliminate extra fluid in the body (diuretics). ? Constipation. What increases the risk? You may be at greater risk for overactive bladder if you:  Are an older adult.  Smoke.  Are going through menopause.  Have prostate problems.  Have a neurological disease, such as stroke, dementia, Parkinson's disease, or multiple sclerosis (MS).  Eat or drink alcohol, spicy food, caffeine, and other things that irritate the bladder.  Are overweight or obese. What are the signs or symptoms? Symptoms of this condition include a sudden, strong urge to urinate. Other symptoms include:  Leaking urine.  Urinating 8 or more times a day.  Waking up to urinate 2 or more times overnight. How is this diagnosed? This condition may be diagnosed based on:  Your symptoms and medical history.  A physical exam.  Blood or urine tests to check for possible causes,  such as infection. You may also need to see a health care provider who specializes in urinary tract problems. This is called a urologist. How is this treated? Treatment for overactive bladder depends on the cause of your condition and whether it is mild or severe. Treatment may include:  Bladder training, such as: ? Learning to control the urge to urinate by following a schedule to urinate at regular intervals. ? Doing Kegel exercises to strengthen the pelvic floor muscles that support your bladder.  Special devices, such as: ? Biofeedback. This uses sensors to help you become aware of your body's signals. ? Electrical stimulation. This uses electrodes placed inside the body (implanted) or outside the body. These electrodes send gentle pulses of electricity to strengthen the nerves or muscles that control the bladder. ? Women may use a plastic device, called a pessary, that fits into the vagina and supports the bladder.  Medicines, such as: ? Antibiotics to treat bladder infection. ? Antispasmodics to stop the bladder from releasing urine at the wrong time. ? Tricyclic antidepressants to relax bladder muscles. ? Injections of botulinum toxin type A directly into the bladder tissue to relax bladder muscles.  Surgery, such as: ? A device may be implanted to help manage the nerve signals that control urination. ? An electrode may be implanted to stimulate electrical signals in the bladder. ? A procedure may be done to change the shape of the bladder. This is done only in very severe cases. Follow these instructions at home: Eating and drinking  Make diet or lifestyle changes recommended by your health care provider. These may include: ? Drinking fluids   throughout the day and not only with meals. ? Cutting down on caffeine or alcohol. ? Eating a healthy and balanced diet to prevent constipation. This may include:  Choosing foods that are high in fiber, such as beans, whole grains, and  fresh fruits and vegetables.  Limiting foods that are high in fat and processed sugars, such as fried and sweet foods.   Lifestyle  Lose weight if needed.  Do not use any products that contain nicotine or tobacco. These include cigarettes, chewing tobacco, and vaping devices, such as e-cigarettes. If you need help quitting, ask your health care provider.   General instructions  Take over-the-counter and prescription medicines only as told by your health care provider.  If you were prescribed an antibiotic medicine, take it as told by your health care provider. Do not stop taking the antibiotic even if you start to feel better.  Use any implants or pessary as told by your health care provider.  If needed, wear pads to absorb urine leakage.  Keep a log to track how much and when you drink, and when you need to urinate. This will help your health care provider monitor your condition.  Keep all follow-up visits. This is important. Contact a health care provider if:  You have a fever or chills.  Your symptoms do not get better with treatment.  Your pain and discomfort get worse.  You have more frequent urges to urinate. Get help right away if:  You are not able to control your bladder. Summary  Overactive bladder refers to a condition in which a person has a sudden and frequent need to urinate.  Several conditions may lead to an overactive bladder.  Treatment for overactive bladder depends on the cause and severity of your condition.  Making lifestyle changes, doing Kegel exercises, keeping a log, and taking medicines can help with this condition. This information is not intended to replace advice given to you by your health care provider. Make sure you discuss any questions you have with your health care provider. Document Revised: 02/26/2020 Document Reviewed: 02/26/2020 Elsevier Patient Education  2021 [employer] Inc.   Kegel Exercises  Kegel exercises can help  strengthen your pelvic floor muscles. The pelvic floor is a group of muscles that support your rectum, small intestine, and bladder. In females, pelvic floor muscles also help support the womb (uterus). These muscles help you control the flow of urine and stool. Kegel exercises are painless and simple, and they do not require any equipment. Your provider may suggest Kegel exercises to:  Improve bladder and bowel control.  Improve sexual response.  Improve weak pelvic floor muscles after surgery to remove the uterus (hysterectomy) or pregnancy (females).  Improve weak pelvic floor muscles after prostate gland removal or surgery (males). Kegel exercises involve squeezing your pelvic floor muscles, which are the same muscles you squeeze when you try to stop the flow of urine or keep from passing gas. The exercises can be done while sitting, standing, or lying down, but it is best to vary your position. Exercises How to do Kegel exercises: 1. Squeeze your pelvic floor muscles tight. You should feel a tight lift in your rectal area. If you are a female, you should also feel a tightness in your vaginal area. Keep your stomach, buttocks, and legs relaxed. 2. Hold the muscles tight for up to 10 seconds. 3. Breathe normally. 4. Relax your muscles. 5. Repeat as told by your health care provider. Repeat this exercise daily as   told by your health care provider. Continue to do this exercise for at least 4-6 weeks, or for as long as told by your health care provider. You may be referred to a physical therapist who can help you learn more about how to do Kegel exercises. Depending on your condition, your health care provider may recommend:  Varying how long you squeeze your muscles.  Doing several sets of exercises every day.  Doing exercises for several weeks.  Making Kegel exercises a part of your regular exercise routine. This information is not intended to replace advice given to you by your health  care provider. Make sure you discuss any questions you have with your health care provider. Document Revised: 10/13/2019 Document Reviewed: 01/26/2018 Elsevier Patient Education  2021 Elsevier Inc.  

## 2020-11-20 NOTE — Progress Notes (Signed)
   11/20/20 1:07 PM   Jennifer Shea 04/20/1976 [id number]  CC: Stress incontinence, overactive bladder/urge incontinence, recent UTI  HPI: I saw Jennifer Shea for the above issues.  She is a healthy 45 year old female who reports both stress and urge incontinence for the last 20 years since having her most recent child.  She has never tried medications for this previously.  She has trouble doing Kegel exercises, and feels like her butt and groin are persistently numb since she had her epidural from childbirth.  She was recently diagnosed with a staph aureus UTI in early May and treated with 3 days of Bactrim.  This improved her frequency somewhat, but she still has bothersome stress and urge incontinence and frequency.  She has nocturia 1-2 times per night.  She does not have recurrent infections, and takes cranberry tablets regularly.  She denies any gross hematuria or dysuria.  Urinalysis today is completely benign with 0-5 WBCs, 0 RBCs, no bacteria, nitrite negative, negative leukocytes.   Family History: Family History  Problem Relation Age of Onset  . Hypertension Father   . Cancer Maternal Grandfather     Social History:  reports that she has never smoked. She has never used smokeless tobacco. No history on file for alcohol use and drug use.  Physical Exam: BP (!) 149/90   Pulse (!) 108   Ht 5\' 2"  (1.575 m)   Wt 165 lb (74.8 kg)   LMP 11/08/2020 (Approximate)   BMI 30.18 kg/m    Constitutional:  Alert and oriented, No acute distress. Cardiovascular: No clubbing, cyanosis, or edema. Respiratory: Normal respiratory effort, no increased work of breathing. GI: Abdomen is soft, nontender, nondistended, no abdominal masses   Assessment & Plan:   45 year old healthy female with mixed stress and urge incontinence.  We discussed that overactive bladder (OAB) is not a disease, but is a symptom complex that is generally not life-threatening.  Symptoms typically include urinary urgency,  frequency, and urge incontinence.  There are numerous treatment options, however there are risks and benefits with both medical and surgical management.  First-line treatment is behavioral therapies including bladder training, pelvic floor muscle training, and fluid management.  Second line treatments include oral antimuscarinics(Ditropan er, Trospium) and beta-3 agonist (Mybetriq). There is typically a period of medication trial (4-8 weeks) to find the optimal therapy and dosing. If symptoms are bothersome despite the above management, third line options include intra-detrusor botox, peripheral tibial nerve stimulation (PTNS), and interstim (SNS). These are more invasive treatments with higher side effect profile, but may improve quality of life for patients with severe OAB symptoms.   In terms of her stress incontinence, we discussed a stepwise approach to treatment starting with Kegel exercises and pelvic floor physical therapy, pessary, and surgical options like slings.  -Pelvic floor physical therapy referral placed -Trial of oxybutynin 10 mg XL daily for OAB -RTC with PA 6 weeks symptom check, if persistently bothersome symptoms would recommend follow-up with Dr. 55, MD 11/20/2020  [facility] [address] [place], [place] [phone]

## 2020-11-22 LAB — URINALYSIS, COMPLETE
Bilirubin, UA: NEGATIVE
Glucose, UA: NEGATIVE
Ketones, UA: NEGATIVE
Leukocytes,UA: NEGATIVE
Nitrite, UA: NEGATIVE
Protein,UA: NEGATIVE
RBC, UA: NEGATIVE
Specific Gravity, UA: 1.01 (ref 1.005–1.030)
Urobilinogen, Ur: 0.2 mg/dL (ref 0.2–1.0)
pH, UA: 7 (ref 5.0–7.5)

## 2020-11-22 LAB — MICROSCOPIC EXAMINATION
Bacteria, UA: NONE SEEN
RBC: NONE SEEN /hpf (ref 0–2)

## 2020-12-02 ENCOUNTER — Other Ambulatory Visit: Payer: Self-pay

## 2020-12-02 ENCOUNTER — Ambulatory Visit
Admission: RE | Admit: 2020-12-02 | Discharge: 2020-12-02 | Disposition: A | Discharge status: Home / Self Care | Payer: No Typology Code available for payment source | Source: Ambulatory Visit | Attending: Family Medicine | Admitting: Family Medicine

## 2020-12-02 DIAGNOSIS — R229 Localized swelling, mass and lump, unspecified: Secondary | ICD-10-CM | POA: Diagnosis not present

## 2020-12-15 ENCOUNTER — Other Ambulatory Visit: Payer: Self-pay | Admitting: Family Medicine

## 2020-12-15 DIAGNOSIS — Z131 Encounter for screening for diabetes mellitus: Secondary | ICD-10-CM

## 2020-12-15 DIAGNOSIS — Z1159 Encounter for screening for other viral diseases: Secondary | ICD-10-CM

## 2020-12-15 DIAGNOSIS — Z13220 Encounter for screening for lipoid disorders: Secondary | ICD-10-CM

## 2020-12-18 ENCOUNTER — Other Ambulatory Visit: Payer: Self-pay

## 2020-12-18 ENCOUNTER — Other Ambulatory Visit (INDEPENDENT_AMBULATORY_CARE_PROVIDER_SITE_OTHER): Payer: No Typology Code available for payment source

## 2020-12-18 DIAGNOSIS — Z1159 Encounter for screening for other viral diseases: Secondary | ICD-10-CM

## 2020-12-18 DIAGNOSIS — Z131 Encounter for screening for diabetes mellitus: Secondary | ICD-10-CM

## 2020-12-18 DIAGNOSIS — Z13220 Encounter for screening for lipoid disorders: Secondary | ICD-10-CM | POA: Diagnosis not present

## 2020-12-18 LAB — LIPID PANEL
Cholesterol: 194 mg/dL (ref 0–200)
HDL: 40.1 mg/dL (ref >39.00)
LDL Cholesterol: 124 mg/dL — ABNORMAL HIGH (ref 0–99)
NonHDL: 153.7
Total CHOL/HDL Ratio: 5
Triglycerides: 147 mg/dL (ref 0.0–149.0)
VLDL: 29.4 mg/dL (ref 0.0–40.0)

## 2020-12-18 LAB — BASIC METABOLIC PANEL
BUN: 12 mg/dL (ref 6–23)
CO2: 26 mEq/L (ref 19–32)
Calcium: 8.9 mg/dL (ref 8.4–10.5)
Chloride: 106 mEq/L (ref 96–112)
Creatinine, Ser: 0.81 mg/dL (ref 0.40–1.20)
GFR: 88.68 mL/min (ref >60.00)
Glucose, Bld: 87 mg/dL (ref 70–99)
Potassium: 4 mEq/L (ref 3.5–5.1)
Sodium: 138 mEq/L (ref 135–145)

## 2020-12-19 LAB — HEPATITIS C ANTIBODY
Hepatitis C Ab: NONREACTIVE
SIGNAL TO CUT-OFF: 0.01 (ref <1.00)

## 2020-12-25 ENCOUNTER — Other Ambulatory Visit: Payer: Self-pay

## 2020-12-25 ENCOUNTER — Encounter: Payer: Self-pay | Admitting: Family Medicine

## 2020-12-25 ENCOUNTER — Ambulatory Visit (INDEPENDENT_AMBULATORY_CARE_PROVIDER_SITE_OTHER): Payer: No Typology Code available for payment source | Admitting: Family Medicine

## 2020-12-25 ENCOUNTER — Telehealth: Payer: Self-pay

## 2020-12-25 VITALS — BP 120/78 | HR 86 | Temp 97.6°F | Ht 62.0 in | Wt 166.2 lb

## 2020-12-25 DIAGNOSIS — Z01419 Encounter for gynecological examination (general) (routine) without abnormal findings: Secondary | ICD-10-CM

## 2020-12-25 DIAGNOSIS — N3946 Mixed incontinence: Secondary | ICD-10-CM

## 2020-12-25 DIAGNOSIS — Z1231 Encounter for screening mammogram for malignant neoplasm of breast: Secondary | ICD-10-CM

## 2020-12-25 DIAGNOSIS — E7800 Pure hypercholesterolemia, unspecified: Secondary | ICD-10-CM

## 2020-12-25 DIAGNOSIS — E785 Hyperlipidemia, unspecified: Secondary | ICD-10-CM | POA: Insufficient documentation

## 2020-12-25 DIAGNOSIS — Z0000 Encounter for general adult medical examination without abnormal findings: Secondary | ICD-10-CM | POA: Diagnosis not present

## 2020-12-25 NOTE — Telephone Encounter (Signed)
[facility] referring for Well woman exam. Called and left voicemail for patient to call back to be scheduled.

## 2020-12-25 NOTE — Assessment & Plan Note (Signed)
Preventative protocols reviewed and updated unless pt declined. Discussed healthy diet and lifestyle.  

## 2020-12-25 NOTE — Patient Instructions (Addendum)
Call to schedule mammogram at your convenience: [facility] at [facility] [phone] We will refer you back to [facility] for well woman exam.  You are doing well today  Work on low cholesterol diet (handout provided).  Return as needed or in 1 year for next physical.   Health Maintenance, Female Adopting a healthy lifestyle and getting preventive care are important in promoting health and wellness. Ask your health care provider about: The right schedule for you to have regular tests and exams. Things you can do on your own to prevent diseases and keep yourself healthy. What should I know about diet, weight, and exercise? Eat a healthy diet  Eat a diet that includes plenty of vegetables, fruits, low-fat dairy products, and lean protein. Do not eat a lot of foods that are high in solid fats, added sugars, or sodium.  Maintain a healthy weight Body mass index (BMI) is used to identify weight problems. It estimates body fat based on height and weight. Your health care provider can help determineyour BMI and help you achieve or maintain a healthy weight. Get regular exercise Get regular exercise. This is one of the most important things you can do for your health. Most adults should: Exercise for at least 150 minutes each week. The exercise should increase your heart rate and make you sweat (moderate-intensity exercise). Do strengthening exercises at least twice a week. This is in addition to the moderate-intensity exercise. Spend less time sitting. Even light physical activity can be beneficial. Watch cholesterol and blood lipids Have your blood tested for lipids and cholesterol at 44 years of age, then havethis test every 5 years. Have your cholesterol levels checked more often if: Your lipid or cholesterol levels are high. You are older than 44 years of age. You are at high risk for heart disease. What should I know about cancer screening? Depending on your health history  and family history, you may need to have cancer screening at various ages. This may include screening for: Breast cancer. Cervical cancer. Colorectal cancer. Skin cancer. Lung cancer. What should I know about heart disease, diabetes, and high blood pressure? Blood pressure and heart disease High blood pressure causes heart disease and increases the risk of stroke. This is more likely to develop in people who have high blood pressure readings, are of African descent, or are overweight. Have your blood pressure checked: Every 3-5 years if you are 33-62 years of age. Every year if you are 79 years old or older. Diabetes Have regular diabetes screenings. This checks your fasting blood sugar level. Have the screening done: Once every three years after age 41 if you are at a normal weight and have a low risk for diabetes. More often and at a younger age if you are overweight or have a high risk for diabetes. What should I know about preventing infection? Hepatitis B If you have a higher risk for hepatitis B, you should be screened for this virus. Talk with your health care provider to find out if you are at risk forhepatitis B infection. Hepatitis C Testing is recommended for: Everyone born from [birth year] through 1964. Anyone with known risk factors for hepatitis C. Sexually transmitted infections (STIs) Get screened for STIs, including gonorrhea and chlamydia, if: You are sexually active and are younger than 44 years of age. You are older than 44 years of age and your health care provider tells you that you are at risk for this type of infection. Your sexual  activity has changed since you were last screened, and you are at increased risk for chlamydia or gonorrhea. Ask your health care provider if you are at risk. Ask your health care provider about whether you are at high risk for HIV. Your health care provider may recommend a prescription medicine to help prevent HIV infection. If you choose to  take medicine to prevent HIV, you should first get tested for HIV. You should then be tested every 3 months for as long as you are taking the medicine. Pregnancy If you are about to stop having your period (premenopausal) and you may become pregnant, seek counseling before you get pregnant. Take 400 to 800 micrograms (mcg) of folic acid every day if you become pregnant. Ask for birth control (contraception) if you want to prevent pregnancy. Osteoporosis and menopause Osteoporosis is a disease in which the bones lose minerals and strength with aging. This can result in bone fractures. If you are 63 years old or older, or if you are at risk for osteoporosis and fractures, ask your health care provider if you should: Be screened for bone loss. Take a calcium or vitamin D supplement to lower your risk of fractures. Be given hormone replacement therapy (HRT) to treat symptoms of menopause. Follow these instructions at home: Lifestyle Do not use any products that contain nicotine or tobacco, such as cigarettes, e-cigarettes, and chewing tobacco. If you need help quitting, ask your health care provider. Do not use street drugs. Do not share needles. Ask your health care provider for help if you need support or information about quitting drugs. Alcohol use Do not drink alcohol if: Your health care provider tells you not to drink. You are pregnant, may be pregnant, or are planning to become pregnant. If you drink alcohol: Limit how much you use to 0-1 drink a day. Limit intake if you are breastfeeding. Be aware of how much alcohol is in your drink. In the [place] one drink equals one 12 oz bottle of beer (355 mL), one 5 oz glass of wine (148 mL), or one 1 oz glass of hard liquor (44 mL). General instructions Schedule regular health, dental, and eye exams. Stay current with your vaccines. Tell your health care provider if: You often feel depressed. You have ever been abused or do not feel safe at  home. Summary Adopting a healthy lifestyle and getting preventive care are important in promoting health and wellness. Follow your health care provider's instructions about healthy diet, exercising, and getting tested or screened for diseases. Follow your health care provider's instructions on monitoring your cholesterol and blood pressure. This information is not intended to replace advice given to you by your health care provider. Make sure you discuss any questions you have with your healthcare provider. Document Revised: 06/01/2018 Document Reviewed: 06/01/2018 Elsevier Patient Education  2022 [employer].

## 2020-12-25 NOTE — Assessment & Plan Note (Signed)
Significant improvement in urinary urgency issues with addition of oxybutynin XL 10mg  daily. Appreciate urology care. Pending PFPT.

## 2020-12-25 NOTE — Progress Notes (Signed)
Patient ID: Jennifer Shea, female    DOB: Oct 03, 1975, 45 y.o.   MRN: [id number]  This visit was conducted in person.  BP 120/78   Pulse 86   Temp 97.6 F (36.4 C) (Temporal)   Ht 5\' 2"  (1.575 m)   Wt 166 lb 4 oz (75.4 kg)   LMP 12/01/2020   SpO2 99%   BMI 30.41 kg/m    CC: CPE Subjective:   HPI: Jennifer Shea is a 45 y.o. female presenting on 12/25/2020 for Annual Exam   See prior note for details. Seen here 2 months ago with progressive severe mixed urinary incontinence symptoms. Found to have staph aureus UTI treated with bactrim 3d course. Referred to urology Dr 02/25/2021 - referred for PFPT, trial oxybutynin 10mg  XL daily for OAB, planned f/u later this month.   Lumps throughout skin s/p soft tissue Richardo Hanks IMPRESSION: 1.3 cm hyperechoic area within the subcutaneous tissues of the right upper extremity, nonspecific but could represent contusion, fat necrosis, or unencapsulated hyperechoic lipoma. Recommend clinical follow-up and ultrasound in 6-12 weeks   Preventative: Colon cancer screening - not due Lung cancer screening - not due Breast cancer screening - due for mammogram (last done 2015). Doesn't do breast exams at home.  Well woman exam - last done remotely through [facility] - requests return  DEXA scan - not due  Flu shot - yearly  COVID vaccine - declines  Td 1994  Pneumonia shot - not due yet  Shingrix - not due  Advanced directive discussion -  Seat belt use discussed Sunscreen use discussed. No changing moles on skin.  Smoking - non smoker Alcohol - none Dentist - yearly  Eye exam - yearly   Lives with husband and FIL Grown children Occ: [employer] Activity: walking dog Nutrition: good water, fruits/vegetables daily      Relevant past medical, surgical, family and social history reviewed and updated as indicated. Interim medical history since our last visit reviewed. Allergies and medications reviewed and updated. Outpatient Medications Prior  to Visit  Medication Sig Dispense Refill   CRANBERRY PO Take by mouth.     oxybutynin (DITROPAN-XL) 10 MG 24 hr tablet Take 1 tablet (10 mg total) by mouth daily. 30 tablet 11   No facility-administered medications prior to visit.     Per HPI unless specifically indicated in ROS section below Review of Systems  Constitutional:  Negative for activity change, appetite change, chills, fatigue, fever and unexpected weight change.  HENT:  Negative for hearing loss.   Eyes:  Negative for visual disturbance.  Respiratory:  Negative for cough, chest tightness, shortness of breath and wheezing.   Cardiovascular:  Positive for leg swelling. Negative for chest pain and palpitations.  Gastrointestinal:  Negative for abdominal distention, abdominal pain, blood in stool, constipation, diarrhea, nausea and vomiting.  Genitourinary:  Negative for difficulty urinating and hematuria.  Musculoskeletal:  Negative for arthralgias, myalgias and neck pain.  Skin:  Negative for rash.  Neurological:  Negative for dizziness, seizures, syncope and headaches.  Hematological:  Negative for adenopathy. Does not bruise/bleed easily.  Psychiatric/Behavioral:  Negative for dysphoric mood. The patient is not nervous/anxious.    Objective:  BP 120/78   Pulse 86   Temp 97.6 F (36.4 C) (Temporal)   Ht 5\' 2"  (1.575 m)   Wt 166 lb 4 oz (75.4 kg)   LMP 12/01/2020   SpO2 99%   BMI 30.41 kg/m   Wt Readings from Last 3  Encounters:  12/25/20 166 lb 4 oz (75.4 kg)  11/20/20 165 lb (74.8 kg)  10/30/20 166 lb 3 oz (75.4 kg)      Physical Exam Vitals and nursing note reviewed.  Constitutional:      Appearance: Normal appearance. She is not ill-appearing.  HENT:     Head: Normocephalic and atraumatic.     Right Ear: Tympanic membrane, ear canal and external ear normal. There is no impacted cerumen.     Left Ear: Tympanic membrane, ear canal and external ear normal. There is no impacted cerumen.  Eyes:     General:         Right eye: No discharge.        Left eye: No discharge.     Extraocular Movements: Extraocular movements intact.     Conjunctiva/sclera: Conjunctivae normal.     Pupils: Pupils are equal, round, and reactive to light.  Neck:     Thyroid: No thyroid mass or thyromegaly.  Cardiovascular:     Rate and Rhythm: Normal rate and regular rhythm.     Pulses: Normal pulses.     Heart sounds: Normal heart sounds. No murmur heard. Pulmonary:     Effort: Pulmonary effort is normal. No respiratory distress.     Breath sounds: Normal breath sounds. No wheezing, rhonchi or rales.  Abdominal:     General: Bowel sounds are normal. There is no distension.     Palpations: Abdomen is soft. There is no mass.     Tenderness: There is no abdominal tenderness. There is no guarding or rebound.     Hernia: No hernia is present.  Musculoskeletal:     Cervical back: Normal range of motion and neck supple. No rigidity.     Right lower leg: No edema.     Left lower leg: No edema.  Lymphadenopathy:     Cervical: No cervical adenopathy.  Skin:    General: Skin is warm and dry.     Findings: No rash.  Neurological:     General: No focal deficit present.     Mental Status: She is alert. Mental status is at baseline.  Psychiatric:        Mood and Affect: Mood normal.        Behavior: Behavior normal.      Results for orders placed or performed in visit on 12/18/20  Hepatitis C antibody  Result Value Ref Range   Hepatitis C Ab NON-REACTIVE NON-REACTIVE   SIGNAL TO CUT-OFF 0.01 <1.00  Basic metabolic panel  Result Value Ref Range   Sodium 138 135 - 145 mEq/L   Potassium 4.0 3.5 - 5.1 mEq/L   Chloride 106 96 - 112 mEq/L   CO2 26 19 - 32 mEq/L   Glucose, Bld 87 70 - 99 mg/dL   BUN 12 6 - 23 mg/dL   Creatinine, Ser 4.09 0.40 - 1.20 mg/dL   GFR 73.53 >29.92 mL/min   Calcium 8.9 8.4 - 10.5 mg/dL  Lipid panel  Result Value Ref Range   Cholesterol 194 0 - 200 mg/dL   Triglycerides 426.8 0.0 - 149.0  mg/dL   HDL 34.19 >62.22 mg/dL   VLDL 97.9 0.0 - 89.2 mg/dL   LDL Cholesterol 119 (H) 0 - 99 mg/dL   Total CHOL/HDL Ratio 5    NonHDL 153.70     Assessment & Plan:  This visit occurred during the SARS-CoV-2 public health emergency.  Safety protocols were in place, including screening questions prior to the  visit, additional usage of staff PPE, and extensive cleaning of exam room while observing appropriate contact time as indicated for disinfecting solutions.   Problem List Items Addressed This Visit     Mixed stress and urge incontinence    Significant improvement in urinary urgency issues with addition of oxybutynin XL 10mg  daily. Appreciate urology care. Pending PFPT.        Health maintenance examination - Primary    Preventative protocols reviewed and updated unless pt declined. Discussed healthy diet and lifestyle.        Hyperlipidemia    Mild, strong fmhx premature CAD.  Encouraged healthy diet choices to improve LDL.  Low chol diet handout provided today.        Other Visit Diagnoses     Encounter for screening mammogram for malignant neoplasm of breast       Relevant Orders   MM 3D SCREEN BREAST BILATERAL   Well woman exam       Relevant Orders   Ambulatory referral to Gynecology        No orders of the defined types were placed in this encounter.  Orders Placed This Encounter  Procedures   MM 3D SCREEN BREAST BILATERAL    Standing Status:   Future    Standing Expiration Date:   12/25/2021    Order Specific Question:   Reason for Exam (SYMPTOM  OR DIAGNOSIS REQUIRED)    Answer:   breast cancer screen    Order Specific Question:   Preferred imaging location?    Answer:   [facility]    Order Specific Question:   Is the patient pregnant?    Answer:   No   Ambulatory referral to Gynecology    Referral Priority:   Routine    Referral Type:   Consultation    Referral Reason:   Specialty Services Required    Requested Specialty:   Gynecology     Number of Visits Requested:   1     Patient instructions: Call to schedule mammogram at your convenience: [facility] at [facility] [phone] We will refer you back to [facility] for well woman exam.  You are doing well today  Work on low cholesterol diet (handout provided).  Return as needed or in 1 year for next physical.   Follow up plan: Return in about 1 year (around 12/25/2021) for annual exam, prior fasting for blood work.  02/25/2022, MD

## 2020-12-25 NOTE — Assessment & Plan Note (Addendum)
Mild, strong fmhx premature CAD.  Encouraged healthy diet choices to improve LDL.  Low chol diet handout provided today.

## 2021-01-01 ENCOUNTER — Other Ambulatory Visit: Payer: Self-pay

## 2021-01-01 ENCOUNTER — Ambulatory Visit (INDEPENDENT_AMBULATORY_CARE_PROVIDER_SITE_OTHER): Payer: No Typology Code available for payment source | Admitting: Physician Assistant

## 2021-01-01 VITALS — BP 134/85 | HR 90 | Ht 62.0 in | Wt 166.0 lb

## 2021-01-01 DIAGNOSIS — N3946 Mixed incontinence: Secondary | ICD-10-CM | POA: Diagnosis not present

## 2021-01-01 LAB — BLADDER SCAN AMB NON-IMAGING

## 2021-01-01 NOTE — Progress Notes (Signed)
01/01/2021 3:11 PM   Jennifer Shea 1976/04/17 [id number]  CC: Chief Complaint  Patient presents with   Follow-up    HPI: Jennifer Shea is a 45 y.o. female with PMH OAB wet with mixed stress and urge incontinence who presents today for symptom recheck on oxybutynin XL 10 mg daily.  She has also been referred to pelvic floor PT, though this has not yet occurred.   Today she reports dramatic symptomatic improvement on oxybutynin XL 10 mg daily.  She states her urinary frequency has decreased to every 2-3 hours, previously every 20 to 30 minutes.  Where she previously wore absorbent pads around-the-clock, she is no longer wearing these at all and reports significant improvement in both her urge and stress incontinence episodes.  Overall, she describes her symptomatic improvement as "amazing" and states the oxybutynin is "a miracle drug."  She reports some dry mouth and dry eye on oxybutynin but denies constipation.  She has not yet received a call to schedule pelvic floor PT.  PVR 14mL.  PMH: Past Medical History:  Diagnosis Date   Acute cystitis without hematuria 02/03/2016   Acute sinusitis 11/03/2017   Hepatomegaly 09/16/2017   Mixed stress and urge incontinence 06/27/2008   Pedal edema 10/30/2020   Skin lumps 10/30/2020    Surgical History: Past Surgical History:  Procedure Laterality Date   TUBAL LIGATION      Home Medications:  Allergies as of 01/01/2021   No Known Allergies      Medication List        Accurate as of January 01, 2021  3:11 PM. If you have any questions, ask your nurse or doctor.          CRANBERRY PO Take by mouth.   oxybutynin 10 MG 24 hr tablet Commonly known as: DITROPAN-XL Take 1 tablet (10 mg total) by mouth daily.        Allergies:  No Known Allergies  Family History: Family History  Problem Relation Age of Onset   Hypertension Father    CAD Father 62       MI   CAD Brother 58       MI   Cancer Maternal Grandfather     Prostate cancer Neg Hx    Bladder Cancer Neg Hx    Kidney cancer Neg Hx     Social History:   reports that she has never smoked. She has never used smokeless tobacco. No history on file for alcohol use and drug use.  Physical Exam: BP 134/85   Pulse 90   Ht 5\' 2"  (1.575 m)   Wt 166 lb (75.3 kg)   BMI 30.36 kg/m   Constitutional:  Alert and oriented, no acute distress, nontoxic appearing HEENT: [place], AT Cardiovascular: No clubbing, cyanosis, or edema Respiratory: Normal respiratory effort, no increased work of breathing Skin: No rashes, bruises or suspicious lesions Neurologic: Grossly intact, no focal deficits, moving all 4 extremities Psychiatric: Normal mood and affect  Laboratory Data: Results for orders placed or performed in visit on 01/01/21  BLADDER SCAN AMB NON-IMAGING  Result Value Ref Range   Scan Result 39mL    Assessment & Plan:   1. Mixed stress and urge incontinence Attic symptomatic improvement on oxybutynin XL 10 mg daily.  Patient is having some dry mouth and dry eye and we discussed continuing the medication versus attempting an alternative anticholinergic agent to see if she tolerates another 1 better, however given how well she is doing on  the oxybutynin she is hesitant to do so.  I counseled her to use lubricating eyedrops and Biotene oral moisturizers to help with symptomatic control.  She is in agreement with this plan.  Agree with pelvic floor PT and encourage patient to pursue this when she gets the call to schedule.  Return in about 1 year (around 01/01/2022) for Symptom recheck with PVR.  Carman Ching, PA-C  [facility] [address] [place], [place] [address] [phone]

## 2021-01-08 ENCOUNTER — Ambulatory Visit
Admission: RE | Admit: 2021-01-08 | Discharge: 2021-01-08 | Disposition: A | Discharge status: Home / Self Care | Payer: No Typology Code available for payment source | Source: Ambulatory Visit | Attending: Family Medicine | Admitting: Family Medicine

## 2021-01-08 ENCOUNTER — Other Ambulatory Visit: Payer: Self-pay

## 2021-01-08 DIAGNOSIS — Z1231 Encounter for screening mammogram for malignant neoplasm of breast: Secondary | ICD-10-CM | POA: Diagnosis present

## 2021-01-14 ENCOUNTER — Ambulatory Visit (INDEPENDENT_AMBULATORY_CARE_PROVIDER_SITE_OTHER): Payer: No Typology Code available for payment source | Admitting: Advanced Practice Midwife

## 2021-01-14 ENCOUNTER — Encounter: Payer: Self-pay | Admitting: Advanced Practice Midwife

## 2021-01-14 ENCOUNTER — Other Ambulatory Visit: Payer: Self-pay

## 2021-01-14 ENCOUNTER — Other Ambulatory Visit (HOSPITAL_COMMUNITY)
Admission: RE | Admit: 2021-01-14 | Discharge: 2021-01-14 | Disposition: A | Discharge status: Home / Self Care | Payer: No Typology Code available for payment source | Source: Ambulatory Visit | Attending: Advanced Practice Midwife | Admitting: Advanced Practice Midwife

## 2021-01-14 VITALS — BP 128/76 | Ht 62.0 in | Wt 166.0 lb

## 2021-01-14 DIAGNOSIS — Z124 Encounter for screening for malignant neoplasm of cervix: Secondary | ICD-10-CM | POA: Insufficient documentation

## 2021-01-14 NOTE — Progress Notes (Signed)
Patient ID: Jennifer Shea, female   DOB: [birth year], 45 y.o.   MRN: [id number]  Reason for Consult: Gynecologic Exam (Referral for pap smear. RM 4)   Referred by Eustaquio Boyden, MD  Subjective:  HPI:  Jennifer Shea is a 45 y.o. female being seen for PAP smear. It has been 20 years since her last PAP which was normal. She was seen a couple of weeks ago by her PCP for annual exam. She has no concerns today.   Past Medical History:  Diagnosis Date   Acute cystitis without hematuria 02/03/2016   Acute sinusitis 11/03/2017   Hepatomegaly 09/16/2017   Mixed stress and urge incontinence 06/27/2008   Pedal edema 10/30/2020   Skin lumps 10/30/2020   Family History  Problem Relation Age of Onset   Hypertension Father    CAD Father 30       MI   CAD Brother 2       MI   Cancer Maternal Grandfather    Prostate cancer Neg Hx    Bladder Cancer Neg Hx    Kidney cancer Neg Hx    Past Surgical History:  Procedure Laterality Date   TUBAL LIGATION      Short Social History:  Social History   Tobacco Use   Smoking status: Never   Smokeless tobacco: Never  Substance Use Topics   Alcohol use: Never    No Known Allergies  Current Outpatient Medications  Medication Sig Dispense Refill   CRANBERRY PO Take by mouth.     oxybutynin (DITROPAN-XL) 10 MG 24 hr tablet Take 1 tablet (10 mg total) by mouth daily. 30 tablet 11   No current facility-administered medications for this visit.   Review of Systems  Constitutional:  Negative for chills and fever.  HENT:  Negative for congestion, ear discharge, ear pain, hearing loss, sinus pain and sore throat.   Eyes:  Negative for blurred vision and double vision.  Respiratory:  Negative for cough, shortness of breath and wheezing.   Cardiovascular:  Negative for chest pain, palpitations and leg swelling.  Gastrointestinal:  Negative for abdominal pain, blood in stool, constipation, diarrhea, heartburn, melena, nausea and vomiting.   Genitourinary:  Negative for dysuria, flank pain, frequency, hematuria and urgency.  Musculoskeletal:  Negative for back pain, joint pain and myalgias.  Skin:  Negative for itching and rash.  Neurological:  Negative for dizziness, tingling, tremors, sensory change, speech change, focal weakness, seizures, loss of consciousness, weakness and headaches.  Endo/Heme/Allergies:  Negative for environmental allergies. Does not bruise/bleed easily.  Psychiatric/Behavioral:  Negative for depression, hallucinations, memory loss, substance abuse and suicidal ideas. The patient is not nervous/anxious and does not have insomnia.        Objective:  Objective   Vitals:   01/14/21 1429  BP: 128/76  Weight: 166 lb (75.3 kg)  Height: 5\' 2"  (1.575 m)   Body mass index is 30.36 kg/m. Constitutional: Well nourished, well developed female in no acute distress.  HEENT: normal Skin: Warm and dry.  Cardiovascular: Regular rate and rhythm.   Respiratory: Clear to auscultation bilateral. Normal respiratory effort Neuro: DTRs 2+, Cranial nerves grossly intact Psych: Alert and Oriented x3. No memory deficits. Normal mood and affect.  MS: normal gait, normal bilateral lower extremity ROM/strength/stability.  Pelvic exam: is not limited by body habitus EGBUS: within normal limits Vagina: within normal limits and with normal mucosa  Cervix: multiple small nabothian cysts   Assessment/Plan:     45 y.o.  G2 P2002 female cervical cancer screening  PAP done today Follow up as needed after lab results     Tresea Mall CNM [facility] 01/14/2021, 3:00 PM

## 2021-01-17 LAB — CYTOLOGY - PAP
Comment: NEGATIVE
Diagnosis: UNDETERMINED — AB
High risk HPV: NEGATIVE

## 2021-02-10 ENCOUNTER — Encounter: Payer: Self-pay | Admitting: Physical Therapy

## 2021-02-10 ENCOUNTER — Ambulatory Visit: Payer: No Typology Code available for payment source | Attending: Urology | Admitting: Physical Therapy

## 2021-02-10 ENCOUNTER — Other Ambulatory Visit: Payer: Self-pay

## 2021-02-10 DIAGNOSIS — R278 Other lack of coordination: Secondary | ICD-10-CM | POA: Diagnosis present

## 2021-02-10 DIAGNOSIS — M6281 Muscle weakness (generalized): Secondary | ICD-10-CM | POA: Diagnosis present

## 2021-02-10 NOTE — Therapy (Signed)
[facility] [address] [place], [place], [address] Phone: [phone]   Fax:  [phone]  Physical Therapy Evaluation  Patient Details  Name: Jennifer Shea MRN: [id number] Date of Birth: [birth year] Referring Provider (PT): Legrand Rams  Encounter Date: 02/10/2021   PT End of Session - 02/10/21 1541     Visit Number 1    Number of Visits 12    Date for PT Re-Evaluation 05/05/21    Authorization Type IE: 02/10/21    PT Start Time 1545    PT Stop Time 1630    PT Time Calculation (min) 45 min    Activity Tolerance Patient tolerated treatment well    Behavior During Therapy [facility] for tasks assessed/performed             Past Medical History:  Diagnosis Date   Acute cystitis without hematuria 02/03/2016   Acute sinusitis 11/03/2017   Hepatomegaly 09/16/2017   Mixed stress and urge incontinence 06/27/2008   Pedal edema 10/30/2020   Skin lumps 10/30/2020    Past Surgical History:  Procedure Laterality Date   TUBAL LIGATION  2002    There were no vitals filed for this visit.  PELVIC FLOOR PHYSICAL THERAPY EVALUATION   SCREENING Red Flags: none Have you had any night sweats? Unexplained weight loss? Saddle anesthesia? Waking up at night with pain? Unexplained changes in bowel or bladder habits?   Mental Status Patient is oriented to person, place and time.  Recent memory is intact.  Remote memory is intact.  Attention span and concentration are intact.  Expressive speech is intact.  Patient's fund of knowledge is within normal limits for educational level.  POSTURE/OBSERVATIONS:  Grossly WNL   SUBJECTIVE  Chief Complaint: Patient states that a previous doctor told her to do "kegels" to manage her leakage but has not been able to do them after her second child. Patient reports the medication given for OAB has helped tremendously the past 3 months but is still experiencing urgency and leakage while attending to  daily activities like taking care of the chicken coop, performing lifting/bending during tasks, and participating in church functions. Patient reports unable "to feel my husband" during penetrative sex and at times there being a wet spot on the sheets; unsure if it is urinary leakage. Patient points out having numbness occasionally in lower spine and buttock region after sitting for awhile but denies radiating symptoms. Patient has noted most recently that consumption of pork products like bacon and bratwursts have caused intestinal distress accompanied with diarrhea/fecal urgency.    Pertinent History:  Falls: negative Driving: yes  Pulmonary disease/dysfunction: negative Surgical history: Positive for see above   Obstetrical History: G2P2 Deliveries: vaginal Tearing/Episiotomy: episiotomy with both children  Gynecological History: Tubal ligation 2002 Endometriosis: negative Pelvic Organ Prolapse: negative  Last Menstrual Period: 01/25/21 Pain with exam: no Heaviness/pressure: no   Urinary History: Incontinence: positive Onset: 1 year Triggers: sneezing, coughing, walking, stress (squat, lift, bend) Amount: less than minimal (1 tbsp) Protective undergarments: none since started medicine 3 months ago Fluid Intake: Yeti 20oz x2 H20, 1-2x/per day Pepsi/Mountain Dew  Nocturia: 1x/night Frequency of urination: 20-45 mins before medicine, every 1 hr with medication Toileting posture: feet flat  Pain with urination: positive for occasional Difficulty initiating urination: negative  Intermittent stream: negative  Frequent UTI: positive for past history  Gastrointestinal History: [place] Stool Chart: Type 7  Frequency of BMs: 2-3x/day  Sexual activity/pain: Pain with intercourse: no  Initial  penetration: no   Deep thrusting: no External stimulation: no Able to achieve orgasm: yes  Location of pain: low back and buttock region Current numbness:  6/10  No radiating pain   Patient  Goals:  Have more control over urination and strengthening PFM in order to participate in sexual function    RANGE OF MOTION: Deferred secondary to time constraints   LEFT RIGHT  Lumbar forward flexion (65):      Lumbar extension (30):     Lumbar lateral flexion (25):     Thoracic and Lumbar rotation (30 degrees):       Hip Flexion (0-125):      Hip IR (0-45):     Hip ER (0-45):     Hip Abduction (0-40):     Hip extension (0-15):       SENSATION: Deferred secondary to time constraints  STRENGTH: MMT Deferred secondary to time constraints  RLE LLE  Hip Flexion    Hip Extension    Hip Abduction     Hip Adduction     Hip ER     Hip IR     Knee Extension    Knee Flexion    Dorsiflexion     Plantarflexion (seated)     ABDOMINAL: Deferred secondary to time constraints  SPECIAL TESTS: Deferred secondary to time constraints  EXTERNAL PELVIC EXAM: Deferred secondary to time constraints Palpation: Breath coordination: present/absent/inconsistent Voluntary Contraction: present/absent Relaxation: full/delayed/non-relaxing Perineal movement with sustained IAP increase ("bear down"): descent/no change/elevation/excessive descent Perineal movement with rapid IAP increase ("cough"): elevation/no change/descent  INTERNAL VAGINAL EXAM: Deferred secondary to time constraints Introitus Appears:  Skin integrity:  Scar mobility: Strength (PERF):  Symmetry: Palpation: Prolapse:  ASSESSMENT Patient is a 45 year old presenting to clinic with chief complaints of urinary leakage, urinary frequency, and numbness in the lower back/buttock region. Today's evaluation suggest deficits in PFM coordination, PFM strengthening, breathing and body mechanics, IAP management, decreased sensation and decreased sexual function as evidenced by 6/10 numbness in the buttock region, urinating every hour, and leakage during strenuous activities like bending, squatting, and lifting. Patient's responses on  FOTO-Urinary Problem (54) outcome measure indicate moderately decreased function. Patient was able to achieve basic understanding of PFM function in bowel/bladder control, sexual function/pleasure, posture, and IAP management during today's evaluation and responded positively to educational interventions. Patient will benefit from continued skilled physical therapy to address deficits in PFM coordination, PFM strengthening, breathing and body mechanics, IAP management, decreased sensation and decreased sexual function in order to increase towards prior level of function and improve overall QOL.  EDUCATION Patient educated on what to expect during course of physical therapy, POC, and provided with HEP including: bladder irritants, urge suppression techniques, and given a bladder diary to complete for next session. Patient verbalized understanding. Patient will benefit from further education in order to maximize compliance and understanding to achieve established long-term goals.      PT Long Term Goals - 02/10/21 1726       PT LONG TERM GOAL #1   Title Patient will demonstrate independent and coordinated diaphragmatic breathing in supine with a 1:2 breathing pattern for improved down-regulation of the nervous system and improved management of intra-abdominal pressures in order to increase function at home and in the community.    Baseline IE: not established    Time 12    Period Weeks    Status New    Target Date 05/05/21      PT LONG TERM GOAL #2  Title Patient will demonstrate circumferential and sequential contraction of >4/5 MMT, > 6 sec hold x10 and 5 consecutive quick flicks with </= 10 min rest between testing bouts, and relaxation of the PFM coordinated with breath for improved management of intra-abdominal pressure and normal bowel and bladder function without the presence of pain nor incontinence in order to improve participation at home and in the community.    Baseline IE: not  established    Time 12    Period Weeks    Status New    Target Date 05/05/21      PT LONG TERM GOAL #3   Title Patient will report less than 3 incidents of stress urinary incontinence over the course of 3 weeks while coughing/sneezing/lifting/bending/squatting in order to demonstrate improved PFM coordination, strength, and function for improved overall QOL.    Baseline IE: less than minimal, 1 tbsp    Time 12    Period Weeks    Status New    Target Date 05/05/21      PT LONG TERM GOAL #4   Title Patient will report confidence in ability to control bladder > 7/10 in order to demonstrate improved function and ability to participate more fully in activities at home and in the community.    Baseline IE: not established    Time 12    Period Weeks    Status New    Target Date 05/05/21               Plan - 02/10/21 1542     Clinical Impression Statement Patient is a 45 year old presenting to clinic with chief complaints of urinary leakage, urinary frequency, and numbness in the lower back/buttock region. Today's evaluation suggest deficits in PFM coordination, PFM strengthening, breathing and body mechanics, IAP management, decreased sensation and decreased sexual function as evidenced by 6/10 numbness in the buttock region, urinating every hour, and leakage during strenuous activities like bending, squatting, and lifting. Patient's responses on FOTO-Urinary Problem (54) outcome measure indicate moderately decreased function. Patient was able to achieve basic understanding of PFM function in bowel/bladder control, sexual function/pleasure, posture, and IAP management during today's evaluation and responded positively to educational interventions. Patient will benefit from continued skilled physical therapy to address deficits in PFM coordination, PFM strengthening, breathing and body mechanics, IAP management, decreased sensation and decreased sexual function in order to increase towards  prior level of function and improve overall QOL.    Personal Factors and Comorbidities Age;Comorbidity 2;Time since onset of injury/illness/exacerbation    Comorbidities Hep C, hyperlipidemia, R shoulder bursitis    Examination-Activity Limitations Transfers;Sit;Bend;Squat;Lift;Carry;Continence;Toileting    Examination-Participation Restrictions Interpersonal Relationship;Cleaning;Community Activity;Yard Work    Conservation officer, historic buildings Evolving/Moderate complexity    Clinical Decision Making Moderate    Rehab Potential Fair    PT Frequency 1x / week    PT Duration 12 weeks    PT Treatment/Interventions ADLs/Self Care Home Management;Biofeedback;Cryotherapy;Electrical Stimulation;Moist Heat;Functional mobility training;Therapeutic activities;Therapeutic exercise;Neuromuscular re-education;Cognitive remediation;Scar mobilization;Orthotic Fit/Training;Patient/family education;Manual techniques;Manual lymph drainage;Taping    PT Next Visit Plan physical assessment    PT Home Exercise Plan bladder irritants, urge suppression worksheet, bladder diary    Consulted and Agree with Plan of Care Patient             Patient will benefit from skilled therapeutic intervention in order to improve the following deficits and impairments:  Decreased endurance, Decreased mobility, Increased muscle spasms, Impaired sensation, Decreased scar mobility, Impaired perceived functional ability, Improper body mechanics, Decreased activity  tolerance, Decreased coordination, Decreased strength, Postural dysfunction  Visit Diagnosis: Other lack of coordination  Muscle weakness (generalized)     Problem List Patient Active Problem List   Diagnosis Date Noted   Health maintenance examination 12/25/2020   Hyperlipidemia 12/25/2020   Skin lumps 10/30/2020   Pedal edema 10/30/2020   Hepatomegaly 09/16/2017   Acute cystitis without hematuria 02/03/2016   Mixed stress and urge incontinence 06/27/2008     Keosha Rossa, SPT  This entire session was performed under direct supervision and direction of a licensed Estate agenttherapist/therapist assistant . I have personally read, edited and approve of the note as written.  Sheria LangKatlin Harker PT, DPT [phone]  02/11/2021, 6:15 PM  [place] [facility] [address] [place], [place], [address] Phone: [phone]   Fax:  [phone]  Name: Brent Bullashley R Starner MRN: [id number] Date of Birth: [birth year]

## 2021-02-17 ENCOUNTER — Ambulatory Visit: Payer: No Typology Code available for payment source | Admitting: Physical Therapy

## 2021-02-25 ENCOUNTER — Encounter: Payer: No Typology Code available for payment source | Admitting: Physical Therapy

## 2021-03-03 ENCOUNTER — Encounter: Payer: No Typology Code available for payment source | Admitting: Physical Therapy

## 2021-03-10 ENCOUNTER — Encounter: Payer: No Typology Code available for payment source | Admitting: Physical Therapy

## 2021-03-17 ENCOUNTER — Encounter: Payer: No Typology Code available for payment source | Admitting: Physical Therapy

## 2021-03-24 ENCOUNTER — Encounter: Payer: No Typology Code available for payment source | Admitting: Physical Therapy

## 2021-03-31 ENCOUNTER — Encounter: Payer: No Typology Code available for payment source | Admitting: Physical Therapy

## 2021-04-07 ENCOUNTER — Encounter: Payer: No Typology Code available for payment source | Admitting: Physical Therapy

## 2021-04-14 ENCOUNTER — Encounter: Payer: No Typology Code available for payment source | Admitting: Physical Therapy

## 2021-04-21 ENCOUNTER — Encounter: Payer: No Typology Code available for payment source | Admitting: Physical Therapy

## 2021-04-28 ENCOUNTER — Encounter: Payer: No Typology Code available for payment source | Admitting: Physical Therapy

## 2021-05-02 ENCOUNTER — Other Ambulatory Visit: Payer: Self-pay

## 2021-05-02 ENCOUNTER — Encounter: Payer: Self-pay | Admitting: Family Medicine

## 2021-05-02 ENCOUNTER — Ambulatory Visit (INDEPENDENT_AMBULATORY_CARE_PROVIDER_SITE_OTHER): Payer: Self-pay | Admitting: Family Medicine

## 2021-05-02 DIAGNOSIS — J0100 Acute maxillary sinusitis, unspecified: Secondary | ICD-10-CM

## 2021-05-02 MED ORDER — PREDNISONE 20 MG PO TABS: ORAL_TABLET | ORAL | 0 refills | Status: DC

## 2021-05-02 MED ORDER — AMOXICILLIN-POT CLAVULANATE 875-125 MG PO TABS: 1.0000 | ORAL_TABLET | Freq: Two times a day (BID) | ORAL | 0 refills | Status: AC

## 2021-05-02 MED ORDER — BENZONATATE 100 MG PO CAPS: 100.0000 mg | ORAL_CAPSULE | Freq: Three times a day (TID) | ORAL | 0 refills | Status: DC | PRN

## 2021-05-02 NOTE — Assessment & Plan Note (Addendum)
Anticipate acute sinusitis with component of bronchitis. Treat with augmentin 7d course, prednisone taper, and tessalon perls. Reviewed standard steroid precautions.  Ok to continue OTC remedies. Update if not improving with treatment.

## 2021-05-02 NOTE — Progress Notes (Signed)
Patient ID: Jennifer Shea, female    DOB: 18-May-1976, 45 y.o.   MRN: [id number]  This visit was conducted in person.  BP 130/78   Pulse 88   Temp 98 F (36.7 C) (Temporal)   Ht 5\' 2"  (1.575 m)   Wt 162 lb 3 oz (73.6 kg)   LMP 04/14/2021   SpO2 99%   BMI 29.66 kg/m    CC: cough, sinus pressure Subjective:   HPI: Jennifer Shea is a 45 y.o. female presenting on 05/02/2021 for Cough (C/o dry cough, sinus pressure and nasal congestion.  Sxs started 04/19/21. Had fever 3-4 days, now resolved. )   2 wk h/o fatigue, cough, chest and head congestion, sinus pressure, initial fever Tmax 102. Febrile for 3-4 days. Cough causes headache. Sinus pressure worse at night time (frontal and maxilla). Decreased appetite and some nausea and diarrhea.   No ST, ear or tooth pain, abd pain.   She treated with mucinex, excedrin and sinus medicine (tylenol, guaifenesin, phenylephrine).   Husband sick recently with bronchitis and pneumonia - he is feeling better.   Has not received flu or COVID vaccines.      Relevant past medical, surgical, family and social history reviewed and updated as indicated. Interim medical history since our last visit reviewed. Allergies and medications reviewed and updated. Outpatient Medications Prior to Visit  Medication Sig Dispense Refill   CRANBERRY PO Take by mouth.     oxybutynin (DITROPAN-XL) 10 MG 24 hr tablet Take 1 tablet (10 mg total) by mouth daily. 30 tablet 11   No facility-administered medications prior to visit.     Per HPI unless specifically indicated in ROS section below Review of Systems  Objective:  BP 130/78   Pulse 88   Temp 98 F (36.7 C) (Temporal)   Ht 5\' 2"  (1.575 m)   Wt 162 lb 3 oz (73.6 kg)   LMP 04/14/2021   SpO2 99%   BMI 29.66 kg/m   Wt Readings from Last 3 Encounters:  05/02/21 162 lb 3 oz (73.6 kg)  01/14/21 166 lb (75.3 kg)  01/01/21 166 lb (75.3 kg)      Physical Exam Vitals and nursing note reviewed.   Constitutional:      Appearance: Normal appearance. She is not ill-appearing.  HENT:     Head: Normocephalic and atraumatic.     Right Ear: Tympanic membrane, ear canal and external ear normal. There is no impacted cerumen.     Left Ear: Tympanic membrane, ear canal and external ear normal. There is no impacted cerumen.     Nose: No congestion or rhinorrhea.     Right Sinus: Maxillary sinus tenderness present. No frontal sinus tenderness.     Left Sinus: Maxillary sinus tenderness present. No frontal sinus tenderness.     Mouth/Throat:     Mouth: Mucous membranes are moist.     Pharynx: Oropharynx is clear. No oropharyngeal exudate or posterior oropharyngeal erythema.  Eyes:     Extraocular Movements: Extraocular movements intact.     Conjunctiva/sclera: Conjunctivae normal.     Pupils: Pupils are equal, round, and reactive to light.  Cardiovascular:     Rate and Rhythm: Normal rate and regular rhythm.     Pulses: Normal pulses.     Heart sounds: Normal heart sounds. No murmur heard. Pulmonary:     Effort: Pulmonary effort is normal. No respiratory distress.     Breath sounds: Normal breath sounds. No wheezing, rhonchi or  rales.     Comments: Lungs clear Lymphadenopathy:     Head:     Right side of head: No submental, submandibular, tonsillar, preauricular or posterior auricular adenopathy.     Left side of head: No submental, submandibular, tonsillar, preauricular or posterior auricular adenopathy.     Cervical: No cervical adenopathy.     Right cervical: No superficial cervical adenopathy.    Left cervical: No superficial cervical adenopathy.     Upper Body:     Right upper body: No supraclavicular adenopathy.     Left upper body: No supraclavicular adenopathy.  Skin:    Findings: No rash.  Neurological:     Mental Status: She is alert.  Psychiatric:        Mood and Affect: Mood normal.        Behavior: Behavior normal.      Results for orders placed or performed in  visit on 01/14/21  Cytology - PAP  Result Value Ref Range   High risk HPV Negative    Adequacy      Satisfactory for evaluation; transformation zone component PRESENT.   Diagnosis (A)     - Atypical squamous cells of undetermined significance (ASC-US)   Comment Normal Reference Range HPV - Negative     Assessment & Plan:  This visit occurred during the SARS-CoV-2 public health emergency.  Safety protocols were in place, including screening questions prior to the visit, additional usage of staff PPE, and extensive cleaning of exam room while observing appropriate contact time as indicated for disinfecting solutions.   Problem List Items Addressed This Visit     Acute sinusitis    Anticipate acute sinusitis with component of bronchitis. Treat with augmentin 7d course, prednisone taper, and tessalon perls. Reviewed standard steroid precautions.  Ok to continue OTC remedies. Update if not improving with treatment.       Relevant Medications   amoxicillin-clavulanate (AUGMENTIN) 875-125 MG tablet   benzonatate (TESSALON) 100 MG capsule   predniSONE (DELTASONE) 20 MG tablet     Meds ordered this encounter  Medications   amoxicillin-clavulanate (AUGMENTIN) 875-125 MG tablet    Sig: Take 1 tablet by mouth 2 (two) times daily for 7 days.    Dispense:  14 tablet    Refill:  0   benzonatate (TESSALON) 100 MG capsule    Sig: Take 1 capsule (100 mg total) by mouth 3 (three) times daily as needed for cough.    Dispense:  30 capsule    Refill:  0   predniSONE (DELTASONE) 20 MG tablet    Sig: Take two tablets daily for 3 days followed by one tablet daily for 3 days    Dispense:  9 tablet    Refill:  0   No orders of the defined types were placed in this encounter.   Patient Instructions  I think you have sinusitis and component of bronchitis  Treat with augmentin antibiotic for 7 days.  For cough, may use tessalon perls.  Take prednisone 6 d taper.  Let us know if not improving  with above  Follow up plan: Return if symptoms worsen or fail to improve.  Eustaquio Boyden, MD

## 2021-05-02 NOTE — Patient Instructions (Addendum)
I think you have sinusitis and component of bronchitis  Treat with augmentin antibiotic for 7 days.  For cough, may use tessalon perls.  Take prednisone 6 d taper.  Let us know if not improving with above

## 2021-05-05 ENCOUNTER — Encounter: Payer: No Typology Code available for payment source | Admitting: Physical Therapy

## 2021-08-19 ENCOUNTER — Other Ambulatory Visit (HOSPITAL_COMMUNITY)
Admission: RE | Admit: 2021-08-19 | Discharge: 2021-08-19 | Disposition: A | Discharge status: Home / Self Care | Payer: BC Managed Care – PPO | Source: Ambulatory Visit | Attending: Family | Admitting: Family

## 2021-08-19 ENCOUNTER — Other Ambulatory Visit: Payer: Self-pay

## 2021-08-19 ENCOUNTER — Ambulatory Visit (INDEPENDENT_AMBULATORY_CARE_PROVIDER_SITE_OTHER): Payer: No Typology Code available for payment source | Admitting: Family

## 2021-08-19 VITALS — BP 140/82 | HR 72 | Ht 62.0 in | Wt 160.0 lb

## 2021-08-19 DIAGNOSIS — N898 Other specified noninflammatory disorders of vagina: Secondary | ICD-10-CM | POA: Diagnosis present

## 2021-08-19 DIAGNOSIS — R350 Frequency of micturition: Secondary | ICD-10-CM | POA: Insufficient documentation

## 2021-08-19 DIAGNOSIS — F418 Other specified anxiety disorders: Secondary | ICD-10-CM | POA: Diagnosis not present

## 2021-08-19 DIAGNOSIS — N3000 Acute cystitis without hematuria: Secondary | ICD-10-CM

## 2021-08-19 LAB — POC URINALSYSI DIPSTICK (AUTOMATED)
Bilirubin, UA: NEGATIVE
Blood, UA: NEGATIVE
Glucose, UA: NEGATIVE
Ketones, UA: NEGATIVE
Nitrite, UA: NEGATIVE
Protein, UA: NEGATIVE
Spec Grav, UA: 1.015 (ref 1.010–1.025)
Urobilinogen, UA: 0.2 E.U./dL
pH, UA: 6 (ref 5.0–8.0)

## 2021-08-19 MED ORDER — SULFAMETHOXAZOLE-TRIMETHOPRIM 800-160 MG PO TABS: 1.0000 | ORAL_TABLET | Freq: Two times a day (BID) | ORAL | 0 refills | Status: AC

## 2021-08-19 MED ORDER — SERTRALINE HCL 50 MG PO TABS: 50.0000 mg | ORAL_TABLET | Freq: Every day | ORAL | 0 refills | Status: DC

## 2021-08-19 NOTE — Patient Instructions (Signed)
Start sertraline (Zoloft) 50 mg for anxiety and depression. Take 1/2 tablet by mouth once daily for about one week, then increase to 1 full tablet thereafter.   Taking the medicine as directed and not missing any doses is one of the best things you can do to treat your anxiety/depression  Here are some things to keep in mind:  Side effects (stomach upset, some increased anxiety) may happen before you notice a benefit.  These side effects typically go away over time. Changes to your dose of medicine or a change in medication all together is sometimes necessary Many people will notice an improvement within two weeks but the full effect of the medication can take up to 4-6 weeks Stopping the medication when you start feeling better often results in a return of symptoms. Most people need to be on medication at least 6-12 months If you start having thoughts of hurting yourself or others after starting this medicine, please call me immediately.    Start monitoring your blood pressure daily, around the same time of day, for the next 2-3 weeks.  Ensure that you have rested for 30 minutes prior to checking your blood pressure. Record your readings and bring them to your next visit.  It was a pleasure seeing you today.   You were found to have a urinary tract infection, you have been prescribed an antibiotic to your preferred pharmacy. Please start antibiotic today as directed.   We are sending your urine for a culture to make sure you do not have a resistant bacteria. We will call you if we need to change your medications.   Please make sure you are drinking plenty of fluids over the next few days.  If your symptoms do not improve over the next 5-7 days, or if they worsen, please let us know. Please also let us know if you have worsening back pain, fevers, chills, or body aches.   Regards,   Eugenia Pancoast

## 2021-08-19 NOTE — Progress Notes (Signed)
Treated, bactrim sent to pharmacy as symptomatic, pending urine culture.

## 2021-08-19 NOTE — Progress Notes (Signed)
Established Patient Office Visit  Subjective:  Patient ID: Jennifer Shea, female    DOB: September 12, 1975  Age: 46 y.o. MRN: [id number]  CC:  Chief Complaint  Patient presents with   Urinary Tract Infection    Uncomfortable feeling when urinating, frequency, pressure--1 week    HPI Jennifer Shea is here today with concerns.   One week ago with urinary frequency, urgency and lower pelvic pain on and off.  No flank pain, no fever. No chills  Does see urology and takes oxybutynin, no recent UTI. Last infection was back in may 2022.  Does have slight vaginal discharge, with white/maybe yellow discharge but states that her period is coming on and she does experience this prior.   Father in law living with them for the last two years. She is not concerned about her physical health but she wants him to move out of the home. Because of this every time she pulls up into her driveway she feels increased agitation and anger because he is still in their home and she feels overwhelmed as well. His home is unlivable at this point and his health has improved, not so much as declined.   GAD 7 : Generalized Anxiety Score 08/19/2021  Nervous, Anxious, on Edge 3  Control/stop worrying 2  Worry too much - different things 2  Trouble relaxing 2  Restless 0  Easily annoyed or irritable 3  Afraid - awful might happen 0  Total GAD 7 Score 12    PHQ9 SCORE ONLY 08/19/2021 12/25/2020 09/05/2015  PHQ-9 Total Score 1 0 0     GAD 7 : Generalized Anxiety Score 08/19/2021  Nervous, Anxious, on Edge 3  Control/stop worrying 2  Worry too much - different things 2  Trouble relaxing 2  Restless 0  Easily annoyed or irritable 3  Afraid - awful might happen 0  Total GAD 7 Score 12    PHQ9 SCORE ONLY 08/19/2021 12/25/2020 09/05/2015  PHQ-9 Total Score 1 0 0     Past Medical History:  Diagnosis Date   Acute cystitis without hematuria 02/03/2016   Acute sinusitis 11/03/2017   Hepatomegaly 09/16/2017   Mixed  stress and urge incontinence 06/27/2008   Pedal edema 10/30/2020   Skin lumps 10/30/2020    Past Surgical History:  Procedure Laterality Date   TUBAL LIGATION  2002    Family History  Problem Relation Age of Onset   Hypertension Father    CAD Father 62       MI   CAD Brother 20       MI   Cancer Maternal Grandfather    Prostate cancer Neg Hx    Bladder Cancer Neg Hx    Kidney cancer Neg Hx     Social History   Socioeconomic History   Marital status: Married    Spouse name: Not on file   Number of children: 2   Years of education: Not on file   Highest education level: Not on file  Occupational History   Not on file  Tobacco Use   Smoking status: Never   Smokeless tobacco: Never  Vaping Use   Vaping Use: Never used  Substance and Sexual Activity   Alcohol use: Never   Drug use: Never   Sexual activity: Yes    Birth control/protection: Surgical    Comment: Tubal  Other Topics Concern   Not on file  Social History Narrative   Lives with husband and FIL   Grown  children   Occ: [employer]   Activity: walking dog   Nutrition: good water, fruits/vegetables daily    Social Determinants of Health   Financial Resource Strain: Not on file  Food Insecurity: Not on file  Transportation Needs: Not on file  Physical Activity: Not on file  Stress: Not on file  Social Connections: Not on file  Intimate Partner Violence: Not on file    Outpatient Medications Prior to Visit  Medication Sig Dispense Refill   CRANBERRY PO Take by mouth.     oxybutynin (DITROPAN-XL) 10 MG 24 hr tablet Take 1 tablet (10 mg total) by mouth daily. 30 tablet 11   benzonatate (TESSALON) 100 MG capsule Take 1 capsule (100 mg total) by mouth 3 (three) times daily as needed for cough. 30 capsule 0   predniSONE (DELTASONE) 20 MG tablet Take two tablets daily for 3 days followed by one tablet daily for 3 days 9 tablet 0   No facility-administered medications prior to visit.    No Known  Allergies  ROS Review of Systems  Constitutional:  Negative for chills and fever.  Gastrointestinal:  Negative for abdominal pain.  Genitourinary:  Positive for frequency, pelvic pain and urgency. Negative for difficulty urinating, dysuria, flank pain, hematuria and vaginal discharge.  Musculoskeletal:  Negative for arthralgias.  Psychiatric/Behavioral:  Positive for sleep disturbance. Negative for decreased concentration, dysphoric mood and suicidal ideas. The patient is nervous/anxious.      Objective:    Physical Exam Constitutional:      General: She is not in acute distress.    Appearance: Normal appearance. She is well-developed and well-groomed. She is obese. She is not ill-appearing.  HENT:     Nose: No congestion or rhinorrhea.     Mouth/Throat:     Pharynx: No pharyngeal swelling, oropharyngeal exudate or posterior oropharyngeal erythema.     Tonsils: No tonsillar exudate.  Neck:     Thyroid: No thyroid mass.  Abdominal:     Tenderness: There is no abdominal tenderness.  Musculoskeletal:     Lumbar back: Normal. No tenderness.  Lymphadenopathy:     Cervical:     Right cervical: No superficial cervical adenopathy.    Left cervical: No superficial cervical adenopathy.  Neurological:     Mental Status: She is alert.  No flank pain on percussion/palpation  BP 140/82    Pulse 72    Ht 5\' 2"  (1.575 m)    Wt 160 lb (72.6 kg)    SpO2 96%    BMI 29.26 kg/m  Wt Readings from Last 3 Encounters:  08/19/21 160 lb (72.6 kg)  05/02/21 162 lb 3 oz (73.6 kg)  01/14/21 166 lb (75.3 kg)     Health Maintenance Due  Topic Date Due   COVID-19 Vaccine (1) Never done   HIV Screening  Never done    There are no preventive care reminders to display for this patient.  No results found for: TSH No results found for: WBC, HGB, HCT, MCV, PLT Lab Results  Component Value Date   NA 138 12/18/2020   K 4.0 12/18/2020   CO2 26 12/18/2020   GLUCOSE 87 12/18/2020   BUN 12 12/18/2020    CREATININE 0.81 12/18/2020   CALCIUM 8.9 12/18/2020   GFR 88.68 12/18/2020   No results found for: HGBA1C    Assessment & Plan:   Problem List Items Addressed This Visit       Genitourinary   Acute cystitis without hematuria  antbx sent to pharmacy, pt to take as directed. Encouraged increased water intake throughout the day. Urine culture/reflex pending results. Choosing to treat due to being symptomatic. If no improvement in the next 2 days pt advised to let me know.       Relevant Medications   sulfamethoxazole-trimethoprim (BACTRIM DS) 800-160 MG tablet     Other   Depression with anxiety    For now patient does not think that her situation is going to change so she agrees to medical management in the time being.  Also recommended that she see a therapist as well to talk through this.   I instructed pt to start sertraline 50 mg 1/2 tablet once daily for 1 week and then increase to a full tablet once daily on week two as tolerated.  We discussed common side effects such as nausea, drowsiness and weight gain.  Also discussed rare but serious side effect of suicidal ideation.  She is instructed to discontinue medication and go directly to ED if this occurs.  Pt verbalizes understanding.  Plan is to follow up in 30 days to evaluate progress.          Relevant Medications   sertraline (ZOLOFT) 50 MG tablet   Vaginal discharge    Ancillary urine ordered pending results      Relevant Orders   Urine cytology ancillary only (Completed)   Urinary frequency - Primary    Dipstick in office as well as urine culture ordered pending results we will treat as patient symptomatic      Relevant Medications   sulfamethoxazole-trimethoprim (BACTRIM DS) 800-160 MG tablet   Other Relevant Orders   Urine Culture (Completed)   POCT Urinalysis Dipstick (Automated) (Completed)   Urine cytology ancillary only (Completed)    Meds ordered this encounter  Medications    sulfamethoxazole-trimethoprim (BACTRIM DS) 800-160 MG tablet    Sig: Take 1 tablet by mouth 2 (two) times daily for 7 days.    Dispense:  14 tablet    Refill:  0    Order Specific Question:   Supervising Provider    Answer:   BEDSOLE, AMY E [[id number]]   sertraline (ZOLOFT) 50 MG tablet    Sig: Take 1 tablet (50 mg total) by mouth daily.    Dispense:  30 tablet    Refill:  0    Order Specific Question:   Supervising Provider    Answer:   Diona Browner, AMY E [id number]    Follow-up: Return in about 2 weeks (around 09/02/2021) for with PCP , follow up on blood pressure and medication.    Eugenia Pancoast, FNP

## 2021-08-20 LAB — URINE CYTOLOGY ANCILLARY ONLY
Bacterial Vaginitis-Urine: NEGATIVE
Candida Urine: NEGATIVE — AB
Candida Urine: POSITIVE — AB

## 2021-08-21 ENCOUNTER — Other Ambulatory Visit: Payer: Self-pay | Admitting: Family

## 2021-08-21 DIAGNOSIS — B379 Candidiasis, unspecified: Secondary | ICD-10-CM

## 2021-08-21 MED ORDER — FLUCONAZOLE 150 MG PO TABS: 150.0000 mg | ORAL_TABLET | Freq: Once | ORAL | 0 refills | Status: AC

## 2021-08-22 DIAGNOSIS — F418 Other specified anxiety disorders: Secondary | ICD-10-CM | POA: Insufficient documentation

## 2021-08-22 DIAGNOSIS — R350 Frequency of micturition: Secondary | ICD-10-CM | POA: Insufficient documentation

## 2021-08-22 DIAGNOSIS — F4329 Adjustment disorder with other symptoms: Secondary | ICD-10-CM | POA: Insufficient documentation

## 2021-08-22 DIAGNOSIS — N898 Other specified noninflammatory disorders of vagina: Secondary | ICD-10-CM | POA: Insufficient documentation

## 2021-08-22 LAB — URINE CULTURE
MICRO NUMBER:: 13067754
SPECIMEN QUALITY:: ADEQUATE

## 2021-08-22 NOTE — Assessment & Plan Note (Signed)
For now patient does not think that her situation is going to change so she agrees to medical management in the time being.  Also recommended that she see a therapist as well to talk through this. ? ? I instructed pt to start sertraline 50 mg 1/2 tablet once daily for 1 week and then increase to a full tablet once daily on week two as tolerated.  We discussed common side effects such as nausea, drowsiness and weight gain.  Also discussed rare but serious side effect of suicidal ideation.  She is instructed to discontinue medication and go directly to ED if this occurs.  Pt verbalizes understanding.  Plan is to follow up in 30 days to evaluate progress.   ? ? ?

## 2021-08-22 NOTE — Assessment & Plan Note (Signed)
antbx sent to pharmacy, pt to take as directed. Encouraged increased water intake throughout the day. Urine culture/reflex pending results. Choosing to treat due to being symptomatic. If no improvement in the next 2 days pt advised to let me know.  

## 2021-08-22 NOTE — Assessment & Plan Note (Signed)
Ancillary urine ordered pending results ?

## 2021-08-22 NOTE — Assessment & Plan Note (Signed)
Dipstick in office as well as urine culture ordered pending results we will treat as patient symptomatic ?

## 2021-09-15 ENCOUNTER — Other Ambulatory Visit: Payer: Self-pay | Admitting: Family

## 2021-09-15 ENCOUNTER — Ambulatory Visit: Payer: No Typology Code available for payment source | Admitting: Family Medicine

## 2021-09-15 DIAGNOSIS — F418 Other specified anxiety disorders: Secondary | ICD-10-CM

## 2021-09-17 ENCOUNTER — Ambulatory Visit (INDEPENDENT_AMBULATORY_CARE_PROVIDER_SITE_OTHER): Payer: BC Managed Care – PPO | Admitting: Family Medicine

## 2021-09-17 ENCOUNTER — Encounter: Payer: Self-pay | Admitting: Family Medicine

## 2021-09-17 ENCOUNTER — Other Ambulatory Visit: Payer: Self-pay

## 2021-09-17 VITALS — BP 124/82 | HR 73 | Temp 97.3°F | Ht 62.0 in | Wt 154.2 lb

## 2021-09-17 DIAGNOSIS — F4329 Adjustment disorder with other symptoms: Secondary | ICD-10-CM | POA: Diagnosis not present

## 2021-09-17 MED ORDER — SERTRALINE HCL 50 MG PO TABS: 50.0000 mg | ORAL_TABLET | Freq: Every day | ORAL | 2 refills | Status: DC

## 2021-09-17 NOTE — Progress Notes (Signed)
? ? Patient ID: Jennifer Shea, female    DOB: 1975/07/17, 46 y.o.   MRN: [id number] ? ?This visit was conducted in person. ? ?BP 124/82   Pulse 73   Temp (!) 97.3 ?F (36.3 ?C) (Temporal)   Ht 5\' 2"  (1.575 m)   Wt 154 lb 4 oz (70 kg)   LMP 09/16/2021   SpO2 99%   BMI 28.21 kg/m?   ? ?CC: 1 mo f/u visit  ?Subjective:  ? ?HPI: ?Jennifer Shea is a 46 y.o. female presenting on 09/17/2021 for Medication Refill and Follow-up (Here for mood f/u, per 09/19/2021. ) ? ? ?See prior note for details.  ?Stressful period dealing with FIL who has been living with them for the past 2 years.  ?Started on sertraline 50mg  daily and rec counselor. She continues going to bible study groups twice per week with benefit.  ?Notes significant improvement since starting sertraline, is better able to manage frustration with current home situation, less irritable, less angry.  ?Tolerates med well with no HA, appetite changes, nausea/vomiting. Mild daytime drowsiness but manageable.  ? ? ?  09/17/2021  ? 11:48 AM 08/19/2021  ? 12:30 PM 12/25/2020  ?  2:08 PM 09/05/2015  ?  4:00 PM  ?Depression screen PHQ 2/9  ?Decreased Interest 0 0 0 0  ?Down, Depressed, Hopeless 0 1 0 0  ?PHQ - 2 Score 0 1 0 0  ?Altered sleeping 0     ?Tired, decreased energy 0     ?Change in appetite 0     ?Feeling bad or failure about yourself  0     ?Trouble concentrating 0     ?Moving slowly or fidgety/restless 0     ?Suicidal thoughts 0     ?PHQ-9 Score 0     ?Difficult doing work/chores Not difficult at all     ?  ? ?  09/17/2021  ? 11:48 AM 08/19/2021  ? 12:29 PM  ?GAD 7 : Generalized Anxiety Score  ?Nervous, Anxious, on Edge 1 3  ?Control/stop worrying 0 2  ?Worry too much - different things 0 2  ?Trouble relaxing 0 2  ?Restless 0 0  ?Easily annoyed or irritable 1 3  ?Afraid - awful might happen 0 0  ?Total GAD 7 Score 2 12  ?Anxiety Difficulty Not difficult at all   ? ?   ? ?Relevant past medical, surgical, family and social history reviewed and updated as indicated.  Interim medical history since our last visit reviewed. ?Allergies and medications reviewed and updated. ?Outpatient Medications Prior to Visit  ?Medication Sig Dispense Refill  ? CRANBERRY PO Take by mouth.    ? oxybutynin (DITROPAN-XL) 10 MG 24 hr tablet Take 1 tablet (10 mg total) by mouth daily. 30 tablet 11  ? sertraline (ZOLOFT) 50 MG tablet Take 1 tablet (50 mg total) by mouth daily. 30 tablet 0  ? ?No facility-administered medications prior to visit.  ?  ? ?Per HPI unless specifically indicated in ROS section below ?Review of Systems ? ?Objective:  ?BP 124/82   Pulse 73   Temp (!) 97.3 ?F (36.3 ?C) (Temporal)   Ht 5\' 2"  (1.575 m)   Wt 154 lb 4 oz (70 kg)   LMP 09/16/2021   SpO2 99%   BMI 28.21 kg/m?   ?Wt Readings from Last 3 Encounters:  ?09/17/21 154 lb 4 oz (70 kg)  ?08/19/21 160 lb (72.6 kg)  ?05/02/21 162 lb 3 oz (73.6 kg)  ?  ?  ?  Physical Exam ?Vitals and nursing note reviewed.  ?Constitutional:   ?   Appearance: Normal appearance. She is not ill-appearing.  ?Neck:  ?   Thyroid: No thyroid mass or thyromegaly.  ?Cardiovascular:  ?   Rate and Rhythm: Normal rate and regular rhythm.  ?   Pulses: Normal pulses.  ?   Heart sounds: Normal heart sounds. No murmur heard. ?Pulmonary:  ?   Effort: Pulmonary effort is normal. No respiratory distress.  ?   Breath sounds: Normal breath sounds. No wheezing, rhonchi or rales.  ?Neurological:  ?   Mental Status: She is alert.  ?Psychiatric:     ?   Mood and Affect: Mood normal.     ?   Behavior: Behavior normal.  ? ?   ? ?Assessment & Plan:  ?This visit occurred during the SARS-CoV-2 public health emergency.  Safety protocols were in place, including screening questions prior to the visit, additional usage of staff PPE, and extensive cleaning of exam room while observing appropriate contact time as indicated for disinfecting solutions.  ? ?Problem List Items Addressed This Visit   ? ? 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  Home stressor  remains however she's noticed significant improvement in symptoms since starting sertraline, and is tolerating this well. Will continue. Also discussed importance of other healthy stress relieving strategies.  ?Reassess at CPE in 6 months. Pt agrees with plan.  ?  ?  ? Relevant Medications  ? sertraline (ZOLOFT) 50 MG tablet  ?  ? ?Meds ordered this encounter  ?Medications  ? sertraline (ZOLOFT) 50 MG tablet  ?  Sig: Take 1 tablet (50 mg total) by mouth daily.  ?  Dispense:  90 tablet  ?  Refill:  2  ? ?No orders of the defined types were placed in this encounter. ? ? ? ?Patient Instructions  ?You are doing well on sertraline 50mg  daily - continue this. ?Return in 6 months for physical.  ? ?Follow up plan: ?Return in about 6 months (around 03/20/2022) for annual exam, prior fasting for blood work. ? ?03/22/2022, MD   ?

## 2021-09-17 NOTE — Patient Instructions (Addendum)
You are doing well on sertraline 50mg  daily - continue this. ?Return in 6 months for physical.  ?

## 2021-09-17 NOTE — Assessment & Plan Note (Signed)
Home stressor remains however she's noticed significant improvement in symptoms since starting sertraline, and is tolerating this well. Will continue. Also discussed importance of other healthy stress relieving strategies.  ?Reassess at CPE in 6 months. Pt agrees with plan.  ?

## 2021-11-04 ENCOUNTER — Encounter: Payer: Self-pay | Admitting: Family Medicine

## 2021-11-04 ENCOUNTER — Ambulatory Visit (INDEPENDENT_AMBULATORY_CARE_PROVIDER_SITE_OTHER): Payer: BC Managed Care – PPO | Admitting: Family Medicine

## 2021-11-04 VITALS — BP 126/84 | HR 81 | Temp 97.8°F | Ht 62.0 in | Wt 160.2 lb

## 2021-11-04 DIAGNOSIS — N63 Unspecified lump in unspecified breast: Secondary | ICD-10-CM | POA: Insufficient documentation

## 2021-11-04 NOTE — Progress Notes (Signed)
? ? Patient ID: Jennifer Shea, female    DOB: 1975/06/26, 46 y.o.   MRN: [id number] ? ?This visit was conducted in person. ? ?BP 126/84   Pulse 81   Temp 97.8 ?F (36.6 ?C) (Temporal)   Ht 5\' 2"  (1.575 m)   Wt 160 lb 4 oz (72.7 kg)   LMP 10/18/2021   SpO2 100%   BMI 29.31 kg/m?   ? ?CC: check R breast lump ?Subjective:  ? ?HPI: ?Jennifer Shea is a 46 y.o. female presenting on 11/04/2021 for Breast Mass (C/o lump in R breast. Noticed on 11/01/21. States today she does not feel lump. ) ? ? ?3 days ago noted breast lump to upper outer left breast.  ?Today not really felling this.  ?No new skin changes, redness, nipple discharge, no trauma/injury to breast.  ? ?Screening mammogram 12/2020 - [facility] @ [place].  ?No fmhx breast cancer.  ? ?H/o skin lumps thought lipoma vs fat necrosis by 01/2021 of soft tissue of arm 11/2020 ?   ? ?Relevant past medical, surgical, family and social history reviewed and updated as indicated. Interim medical history since our last visit reviewed. ?Allergies and medications reviewed and updated. ?Outpatient Medications Prior to Visit  ?Medication Sig Dispense Refill  ? CRANBERRY PO Take by mouth.    ? oxybutynin (DITROPAN-XL) 10 MG 24 hr tablet Take 1 tablet (10 mg total) by mouth daily. 30 tablet 11  ? sertraline (ZOLOFT) 50 MG tablet Take 1 tablet (50 mg total) by mouth daily. 90 tablet 2  ? ?No facility-administered medications prior to visit.  ?  ? ?Per HPI unless specifically indicated in ROS section below ?Review of Systems ? ?Objective:  ?BP 126/84   Pulse 81   Temp 97.8 ?F (36.6 ?C) (Temporal)   Ht 5\' 2"  (1.575 m)   Wt 160 lb 4 oz (72.7 kg)   LMP 10/18/2021   SpO2 100%   BMI 29.31 kg/m?   ?Wt Readings from Last 3 Encounters:  ?11/04/21 160 lb 4 oz (72.7 kg)  ?09/17/21 154 lb 4 oz (70 kg)  ?08/19/21 160 lb (72.6 kg)  ?  ?  ?Physical Exam ?Vitals and nursing note reviewed.  ?Constitutional:   ?   Appearance: Normal appearance. She is not ill-appearing.  ?Chest:  ?Breasts: ?    Breasts are symmetrical.  ?   Right: Mass (small lump 6 o clock) present. No swelling, bleeding, inverted nipple, nipple discharge, skin change or tenderness.  ?   Left: Mass (small lump 2 o clock) present. No swelling, bleeding, inverted nipple, nipple discharge, skin change or tenderness.  ? ? ?Lymphadenopathy:  ?   Upper Body:  ?   Right upper body: No supraclavicular, axillary or pectoral adenopathy.  ?   Left upper body: No supraclavicular, axillary or pectoral adenopathy.  ?Neurological:  ?   Mental Status: She is alert.  ? ?   ? ?Assessment & Plan:  ? ?Problem List Items Addressed This Visit   ? ? Mass of breast - Primary  ?  Small lumps palpated to bilateral breasts, without significant suspicious findings.  Question fibrocystic breast changes.  She already largely avoids caffeinated beverages. ?We will order diagnostic bilateral mammograms and bilateral ultrasounds as needed.  Number provided for patient to call and schedule appointments. ? ?  ?  ? Relevant Orders  ? 09/19/21 BREAST LTD UNI LEFT INC AXILLA  ? MM DIAG BREAST TOMO BILATERAL  ? 08/21/21 BREAST LTD UNI RIGHT INC AXILLA  ?  ? ?  No orders of the defined types were placed in this encounter. ? ?Orders Placed This Encounter  ?Procedures  ? US BREAST LTD UNI LEFT INC AXILLA  ?  Standing Status:   Future  ?  Standing Expiration Date:   11/05/2022  ?  Order Specific Question:   Reason for Exam (SYMPTOM  OR DIAGNOSIS REQUIRED)  ?  Answer:   L breast lump 2 o clock  ?  Order Specific Question:   Preferred imaging location?  ?  Answer:   [facility]  ? MM DIAG BREAST TOMO BILATERAL  ?  Standing Status:   Future  ?  Standing Expiration Date:   11/05/2022  ?  Order Specific Question:   Reason for Exam (SYMPTOM  OR DIAGNOSIS REQUIRED)  ?  Answer:   L breast lump 2 o clock, R breast lump 6 o clock  ?  Order Specific Question:   Is the patient pregnant?  ?  Answer:   No  ?  Order Specific Question:   Preferred imaging location?  ?  Answer:   [facility]  ? US  BREAST LTD UNI RIGHT INC AXILLA  ?  Standing Status:   Future  ?  Standing Expiration Date:   11/05/2022  ?  Order Specific Question:   Reason for Exam (SYMPTOM  OR DIAGNOSIS REQUIRED)  ?  Answer:   R breast lump 6 o clock  ?  Order Specific Question:   Preferred imaging location?  ?  Answer:   [facility]  ? ? ? ?Patient Instructions  ?Breast exam ok today.  ?We will order detailed breast mammogram and ultrasound if needed.  ?You may call to schedule mammogram at your convenience: ?[facility] [phone] ? ?Follow up plan: ?Return if symptoms worsen or fail to improve. ? ?Eustaquio Boyden, MD   ?

## 2021-11-04 NOTE — Patient Instructions (Addendum)
Breast exam ok today.  ?We will order detailed breast mammogram and ultrasound if needed.  ?You may call to schedule mammogram at your convenience: ?[facility] [phone] ?

## 2021-11-04 NOTE — Assessment & Plan Note (Signed)
Small lumps palpated to bilateral breasts, without significant suspicious findings.  Question fibrocystic breast changes.  She already largely avoids caffeinated beverages. ?We will order diagnostic bilateral mammograms and bilateral ultrasounds as needed.  Number provided for patient to call and schedule appointments. ?

## 2021-11-25 ENCOUNTER — Other Ambulatory Visit: Payer: Self-pay | Admitting: Urology

## 2021-12-03 ENCOUNTER — Other Ambulatory Visit: Payer: Self-pay | Admitting: Urology

## 2021-12-03 ENCOUNTER — Encounter: Payer: Self-pay | Admitting: *Deleted

## 2022-01-01 ENCOUNTER — Ambulatory Visit: Payer: BC Managed Care – PPO | Admitting: Physician Assistant

## 2022-01-01 VITALS — BP 132/82 | HR 79 | Ht 62.0 in | Wt 155.0 lb

## 2022-01-01 DIAGNOSIS — N3946 Mixed incontinence: Secondary | ICD-10-CM | POA: Diagnosis not present

## 2022-01-01 LAB — BLADDER SCAN AMB NON-IMAGING

## 2022-01-01 MED ORDER — OXYBUTYNIN CHLORIDE ER 10 MG PO TB24: 10.0000 mg | ORAL_TABLET | Freq: Every day | ORAL | 11 refills | Status: DC

## 2022-01-01 NOTE — Progress Notes (Signed)
01/01/2022 5:57 PM   Jennifer Shea Apr 27, 1976 [id number]  CC: Chief Complaint  Patient presents with   Urinary Incontinence   Follow-up   HPI: Jennifer Shea is a 46 y.o. female with PMH OAB wet with mixed stress and urge incontinence on oxybutynin XL 10 mg daily who presents today for annual follow-up.   Today she reports continued symptomatic improvement on oxybutynin.  She is overall pleased with the medication and wishes to continue it.  She continues to experience some stress incontinence.  She was contacted about pursuing pelvic floor physical therapy, however with her insurance this was cost prohibitive for her so it was not completed.  She feels overall she is holding her bladder better than prior.  She has noticed on impact of caffeinated and carbonated beverages on her overactivity and has been trying to cut back on these beverages for that reason.  She takes a cranberry supplement once daily.  She reports 1 UTI in the past year, treated elsewhere.  PVR 0 mL  PMH: Past Medical History:  Diagnosis Date   Acute cystitis without hematuria 02/03/2016   Acute sinusitis 11/03/2017   Hepatomegaly 09/16/2017   Mixed stress and urge incontinence 06/27/2008   Pedal edema 10/30/2020   Skin lumps 10/30/2020    Surgical History: Past Surgical History:  Procedure Laterality Date   TUBAL LIGATION  2002    Home Medications:  Allergies as of 01/01/2022   No Known Allergies      Medication List        Accurate as of January 01, 2022  5:57 PM. If you have any questions, ask your nurse or doctor.          CRANBERRY PO Take by mouth.   oxybutynin 10 MG 24 hr tablet Commonly known as: DITROPAN-XL Take 1 tablet (10 mg total) by mouth daily.   sertraline 50 MG tablet Commonly known as: Zoloft Take 1 tablet (50 mg total) by mouth daily.        Allergies:  No Known Allergies  Family History: Family History  Problem Relation Age of Onset   Hypertension Father    CAD  Father 40       MI   CAD Brother 44       MI   Cancer Maternal Grandfather    Prostate cancer Neg Hx    Bladder Cancer Neg Hx    Kidney cancer Neg Hx     Social History:   reports that she has never smoked. She has never used smokeless tobacco. She reports that she does not drink alcohol and does not use drugs.  Physical Exam: BP 132/82   Pulse 79   Ht 5\' 2"  (1.575 m)   Wt 155 lb (70.3 kg)   BMI 28.35 kg/m   Constitutional:  Alert and oriented, no acute distress, nontoxic appearing HEENT: [place], AT Cardiovascular: No clubbing, cyanosis, or edema Respiratory: Normal respiratory effort, no increased work of breathing Skin: No rashes, bruises or suspicious lesions Neurologic: Grossly intact, no focal deficits, moving all 4 extremities Psychiatric: Normal mood and affect  Laboratory Data: Results for orders placed or performed in visit on 01/01/22  Bladder Scan (Post Void Residual) in office  Result Value Ref Range   Scan Result 41mL    Assessment & Plan:   1. Mixed stress and urge incontinence Continued symptomatic improvement on oxybutynin XL, will plan to continue this.  We will see annually for symptom recheck. - Bladder Scan (Post Void  Residual) in office - oxybutynin (DITROPAN-XL) 10 MG 24 hr tablet; Take 1 tablet (10 mg total) by mouth daily.  Dispense: 30 tablet; Refill: 11  Return in about 1 year (around 01/02/2023) for Annual OAB f/u with PVR.  Carman Ching, PA-C  [facility] [address] [place], [place] [address] [phone]

## 2022-03-19 ENCOUNTER — Ambulatory Visit: Payer: BC Managed Care – PPO | Admitting: Podiatry

## 2022-03-22 ENCOUNTER — Other Ambulatory Visit: Payer: Self-pay | Admitting: Family Medicine

## 2022-03-22 DIAGNOSIS — E7800 Pure hypercholesterolemia, unspecified: Secondary | ICD-10-CM

## 2022-03-25 ENCOUNTER — Other Ambulatory Visit (INDEPENDENT_AMBULATORY_CARE_PROVIDER_SITE_OTHER): Payer: BC Managed Care – PPO

## 2022-03-25 ENCOUNTER — Ambulatory Visit: Payer: BC Managed Care – PPO | Admitting: Podiatry

## 2022-03-25 DIAGNOSIS — E7800 Pure hypercholesterolemia, unspecified: Secondary | ICD-10-CM

## 2022-03-25 DIAGNOSIS — L600 Ingrowing nail: Secondary | ICD-10-CM

## 2022-03-25 LAB — COMPREHENSIVE METABOLIC PANEL
ALT: 17 U/L (ref 0–35)
AST: 14 U/L (ref 0–37)
Albumin: 4.3 g/dL (ref 3.5–5.2)
Alkaline Phosphatase: 66 U/L (ref 39–117)
BUN: 12 mg/dL (ref 6–23)
CO2: 27 mEq/L (ref 19–32)
Calcium: 9.2 mg/dL (ref 8.4–10.5)
Chloride: 103 mEq/L (ref 96–112)
Creatinine, Ser: 0.75 mg/dL (ref 0.40–1.20)
GFR: 96.4 mL/min (ref >60.00)
Glucose, Bld: 118 mg/dL — ABNORMAL HIGH (ref 70–99)
Potassium: 4 mEq/L (ref 3.5–5.1)
Sodium: 137 mEq/L (ref 135–145)
Total Bilirubin: 0.5 mg/dL (ref 0.2–1.2)
Total Protein: 6.6 g/dL (ref 6.0–8.3)

## 2022-03-25 LAB — LIPID PANEL
Cholesterol: 229 mg/dL — ABNORMAL HIGH (ref 0–200)
HDL: 48.8 mg/dL (ref >39.00)
LDL Cholesterol: 150 mg/dL — ABNORMAL HIGH (ref 0–99)
NonHDL: 180.64
Total CHOL/HDL Ratio: 5
Triglycerides: 154 mg/dL — ABNORMAL HIGH (ref 0.0–149.0)
VLDL: 30.8 mg/dL (ref 0.0–40.0)

## 2022-03-25 LAB — TSH: TSH: 3.11 u[IU]/mL (ref 0.35–5.50)

## 2022-03-26 ENCOUNTER — Ambulatory Visit: Payer: BC Managed Care – PPO | Admitting: Podiatry

## 2022-03-29 NOTE — Progress Notes (Signed)
  Subjective:  Patient ID: MINERVA BLUETT, female    DOB: [birth year],  MRN: [id number]  Chief Complaint  Patient presents with   Ingrown Toenail    New Patient, ingrown right great toenail     46 y.o. female presents with the above complaint. History confirmed with patient.  This is the first time she has had this  Objective:  Physical Exam: warm, good capillary refill, no trophic changes or ulcerative lesions, normal DP and PT pulses, normal sensory exam, and ingrown nail right hallux medial border  Assessment:   1. Ingrowing right great toenail      Plan:  Patient was evaluated and treated and all questions answered.    Ingrown Nail, right -Patient elects to proceed with minor surgery to remove ingrown toenail today. Consent reviewed and signed by patient. -Ingrown nail excised. See procedure note. -Educated on post-procedure care including soaking. Written instructions provided and reviewed.   Procedure: Excision of Ingrown Toenail Location: Right 1st toe medial nail borders. Anesthesia: Lidocaine 1% plain; 1.5 mL and Marcaine 0.5% plain; 1.5 mL, digital block. Skin Prep: Betadine. Dressing: Silvadene; telfa; dry, sterile, compression dressing. Technique: Following skin prep, the toe was exsanguinated and a tourniquet was secured at the base of the toe. The affected nail border was freed, split with a nail splitter, and excised.  The tourniquet was then removed and sterile dressing applied. Disposition: Patient tolerated procedure well.     No follow-ups on file.

## 2022-04-01 ENCOUNTER — Encounter: Payer: Self-pay | Admitting: Family Medicine

## 2022-04-01 ENCOUNTER — Ambulatory Visit (INDEPENDENT_AMBULATORY_CARE_PROVIDER_SITE_OTHER): Payer: BC Managed Care – PPO | Admitting: Family Medicine

## 2022-04-01 VITALS — BP 114/74 | HR 76 | Temp 97.4°F | Ht 62.0 in | Wt 158.0 lb

## 2022-04-01 DIAGNOSIS — N3946 Mixed incontinence: Secondary | ICD-10-CM

## 2022-04-01 DIAGNOSIS — Z23 Encounter for immunization: Secondary | ICD-10-CM

## 2022-04-01 DIAGNOSIS — E7800 Pure hypercholesterolemia, unspecified: Secondary | ICD-10-CM

## 2022-04-01 DIAGNOSIS — R739 Hyperglycemia, unspecified: Secondary | ICD-10-CM | POA: Diagnosis not present

## 2022-04-01 DIAGNOSIS — Z8249 Family history of ischemic heart disease and other diseases of the circulatory system: Secondary | ICD-10-CM

## 2022-04-01 DIAGNOSIS — Z0000 Encounter for general adult medical examination without abnormal findings: Secondary | ICD-10-CM | POA: Diagnosis not present

## 2022-04-01 DIAGNOSIS — R87610 Atypical squamous cells of undetermined significance on cytologic smear of cervix (ASC-US): Secondary | ICD-10-CM | POA: Insufficient documentation

## 2022-04-01 DIAGNOSIS — Z1211 Encounter for screening for malignant neoplasm of colon: Secondary | ICD-10-CM

## 2022-04-01 LAB — POCT GLYCOSYLATED HEMOGLOBIN (HGB A1C): Hemoglobin A1C: 5.1 % (ref 4.0–5.6)

## 2022-04-01 NOTE — Patient Instructions (Addendum)
Tdap today  Fingerstick a1c today.  Call to schedule mammogram at your convenience: [facility] at [facility] [phone] Call to schedule well woman exam for repeat pap smear.  Pass by lab to pick up stool kit.  Return as needed or in 1 year for next physical   Health Maintenance, Female Adopting a healthy lifestyle and getting preventive care are important in promoting health and wellness. Ask your health care provider about: The right schedule for you to have regular tests and exams. Things you can do on your own to prevent diseases and keep yourself healthy. What should I know about diet, weight, and exercise? Eat a healthy diet  Eat a diet that includes plenty of vegetables, fruits, low-fat dairy products, and lean protein. Do not eat a lot of foods that are high in solid fats, added sugars, or sodium. Maintain a healthy weight Body mass index (BMI) is used to identify weight problems. It estimates body fat based on height and weight. Your health care provider can help determine your BMI and help you achieve or maintain a healthy weight. Get regular exercise Get regular exercise. This is one of the most important things you can do for your health. Most adults should: Exercise for at least 150 minutes each week. The exercise should increase your heart rate and make you sweat (moderate-intensity exercise). Do strengthening exercises at least twice a week. This is in addition to the moderate-intensity exercise. Spend less time sitting. Even light physical activity can be beneficial. Watch cholesterol and blood lipids Have your blood tested for lipids and cholesterol at 45 years of age, then have this test every 5 years. Have your cholesterol levels checked more often if: Your lipid or cholesterol levels are high. You are older than 45 years of age. You are at high risk for heart disease. What should I know about cancer screening? Depending on your health history and family  history, you may need to have cancer screening at various ages. This may include screening for: Breast cancer. Cervical cancer. Colorectal cancer. Skin cancer. Lung cancer. What should I know about heart disease, diabetes, and high blood pressure? Blood pressure and heart disease High blood pressure causes heart disease and increases the risk of stroke. This is more likely to develop in people who have high blood pressure readings or are overweight. Have your blood pressure checked: Every 3-5 years if you are 72-67 years of age. Every year if you are 54 years old or older. Diabetes Have regular diabetes screenings. This checks your fasting blood sugar level. Have the screening done: Once every three years after age 54 if you are at a normal weight and have a low risk for diabetes. More often and at a younger age if you are overweight or have a high risk for diabetes. What should I know about preventing infection? Hepatitis B If you have a higher risk for hepatitis B, you should be screened for this virus. Talk with your health care provider to find out if you are at risk for hepatitis B infection. Hepatitis C Testing is recommended for: Everyone born from [birth year] through 1964. Anyone with known risk factors for hepatitis C. Sexually transmitted infections (STIs) Get screened for STIs, including gonorrhea and chlamydia, if: You are sexually active and are younger than 45 years of age. You are older than 45 years of age and your health care provider tells you that you are at risk for this type of infection. Your sexual activity has  changed since you were last screened, and you are at increased risk for chlamydia or gonorrhea. Ask your health care provider if you are at risk. Ask your health care provider about whether you are at high risk for HIV. Your health care provider may recommend a prescription medicine to help prevent HIV infection. If you choose to take medicine to prevent HIV, you  should first get tested for HIV. You should then be tested every 3 months for as long as you are taking the medicine. Pregnancy If you are about to stop having your period (premenopausal) and you may become pregnant, seek counseling before you get pregnant. Take 400 to 800 micrograms (mcg) of folic acid every day if you become pregnant. Ask for birth control (contraception) if you want to prevent pregnancy. Osteoporosis and menopause Osteoporosis is a disease in which the bones lose minerals and strength with aging. This can result in bone fractures. If you are 62 years old or older, or if you are at risk for osteoporosis and fractures, ask your health care provider if you should: Be screened for bone loss. Take a calcium or vitamin D supplement to lower your risk of fractures. Be given hormone replacement therapy (HRT) to treat symptoms of menopause. Follow these instructions at home: Alcohol use Do not drink alcohol if: Your health care provider tells you not to drink. You are pregnant, may be pregnant, or are planning to become pregnant. If you drink alcohol: Limit how much you have to: 0-1 drink a day. Know how much alcohol is in your drink. In the [place] one drink equals one 12 oz bottle of beer (355 mL), one 5 oz glass of wine (148 mL), or one 1 oz glass of hard liquor (44 mL). Lifestyle Do not use any products that contain nicotine or tobacco. These products include cigarettes, chewing tobacco, and vaping devices, such as e-cigarettes. If you need help quitting, ask your health care provider. Do not use street drugs. Do not share needles. Ask your health care provider for help if you need support or information about quitting drugs. General instructions Schedule regular health, dental, and eye exams. Stay current with your vaccines. Tell your health care provider if: You often feel depressed. You have ever been abused or do not feel safe at home. Summary Adopting a healthy  lifestyle and getting preventive care are important in promoting health and wellness. Follow your health care provider's instructions about healthy diet, exercising, and getting tested or screened for diseases. Follow your health care provider's instructions on monitoring your cholesterol and blood pressure. This information is not intended to replace advice given to you by your health care provider. Make sure you discuss any questions you have with your health care provider. Document Revised: 10/28/2020 Document Reviewed: 10/28/2020 Elsevier Patient Education  [employer].

## 2022-04-01 NOTE — Assessment & Plan Note (Signed)
Update POC A1c.

## 2022-04-01 NOTE — Assessment & Plan Note (Signed)
Consider Lp(a) next labs.

## 2022-04-01 NOTE — Progress Notes (Signed)
Patient ID: Jennifer Shea, female    DOB: May 13, 1976, 46 y.o.   MRN: [id number]  This visit was conducted in person.  BP 114/74 (BP Location: Left Arm, Patient Position: Sitting, Cuff Size: Normal)   Pulse 76   Temp (!) 97.4 F (36.3 C)   Ht '5\' 2"'  (1.575 m)   Wt 158 lb (71.7 kg)   SpO2 100%   BMI 28.90 kg/m    CC: CPE Subjective:   HPI: Jennifer Shea is a 46 y.o. female presenting on 04/01/2022 for Annual Exam (Sees GYN at [facility].  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 Notes limiting sweetened beverages and caffeine has helped. Notes trouble with smoothies as well.   Preventative: Colon cancer screening - discussed, would like ifob Mammogram - Birads1 12/2020 @ [place]. Does not do breast exams at home. No fmhx breast cancer.  Well woman exam - sees [facility], last visit 12/2020 with pap showing ASCUS - rec rpt 1 yr - will call to schedule appointment.  LMP - last week of Sept Lung cancer screening - not eligible DEXA scan - not due  Flu shot - yearly, declines today  COVID vaccine - declines  Td 1994, Tdap today Pneumonia shot - not due yet  Shingrix - not due  Advanced directive discussion -  Seat belt use discussed Sunscreen use discussed. No changing moles on skin.  Smoking - non smoker Alcohol - none Dentist - yearly  Eye exam - yearly    Lives with husband and FIL Grown children Occ[place] [employer] school [employer]5 mi daily at work  Nutrition: good water, fruits/vegetables daily      Relevant past medical, surgical, family and social history reviewed and updated as indicated. Interim medical history since our last visit reviewed. Allergies and medications reviewed and updated. Outpatient Medications Prior to Visit  Medication Sig Dispense Refill   CRANBERRY PO Take by mouth.     oxybutynin (DITROPAN-XL) 10 MG 24 hr tablet Take 1 tablet (10 mg  total) by mouth daily. 30 tablet 11   sertraline (ZOLOFT) 50 MG tablet Take 1 tablet (50 mg total) by mouth daily. 90 tablet 2   No facility-administered medications prior to visit.     Per HPI unless specifically indicated in ROS section below Review of Systems  Constitutional:  Negative for activity change, appetite change, chills, fatigue, fever and unexpected weight change.  HENT:  Negative for hearing loss.   Eyes:  Negative for visual disturbance.  Respiratory:  Negative for cough, chest tightness, shortness of breath and wheezing.   Cardiovascular:  Negative for chest pain, palpitations and leg swelling.  Gastrointestinal:  Negative for abdominal distention, abdominal pain, blood in stool, constipation, diarrhea, nausea and vomiting.  Genitourinary:  Negative for difficulty urinating and hematuria.  Musculoskeletal:  Negative for arthralgias, myalgias and neck pain.  Skin:  Negative for rash.  Neurological:  Negative for dizziness, seizures, syncope and headaches.  Hematological:  Negative for adenopathy. Does not bruise/bleed easily.  Psychiatric/Behavioral:  Negative for dysphoric mood. The patient is not nervous/anxious.     Objective:  BP 114/74 (BP Location: Left Arm, Patient Position: Sitting, Cuff Size: Normal)   Pulse 76   Temp (!) 97.4 F (36.3 C)   Ht '5\' 2"'  (1.575 m)   Wt 158 lb (71.7 kg)   SpO2 100%   BMI 28.90 kg/m   Wt  Readings from Last 3 Encounters:  04/01/22 158 lb (71.7 kg)  01/01/22 155 lb (70.3 kg)  11/04/21 160 lb 4 oz (72.7 kg)      Physical Exam Vitals and nursing note reviewed.  Constitutional:      Appearance: Normal appearance. She is not ill-appearing.  HENT:     Head: Normocephalic and atraumatic.     Right Ear: Tympanic membrane, ear canal and external ear normal. There is no impacted cerumen.     Left Ear: Tympanic membrane, ear canal and external ear normal. There is no impacted cerumen.  Eyes:     General:        Right eye: No  discharge.        Left eye: No discharge.     Extraocular Movements: Extraocular movements intact.     Conjunctiva/sclera: Conjunctivae normal.     Pupils: Pupils are equal, round, and reactive to light.  Neck:     Thyroid: No thyroid mass or thyromegaly.  Cardiovascular:     Rate and Rhythm: Normal rate and regular rhythm.     Pulses: Normal pulses.     Heart sounds: Normal heart sounds. No murmur heard. Pulmonary:     Effort: Pulmonary effort is normal. No respiratory distress.     Breath sounds: Normal breath sounds. No wheezing, rhonchi or rales.  Abdominal:     General: Bowel sounds are normal. There is no distension.     Palpations: Abdomen is soft. There is no mass.     Tenderness: There is no abdominal tenderness. There is no guarding or rebound.     Hernia: No hernia is present.  Musculoskeletal:     Cervical back: Normal range of motion and neck supple. No rigidity.     Right lower leg: No edema.     Left lower leg: No edema.  Lymphadenopathy:     Cervical: No cervical adenopathy.  Skin:    General: Skin is warm and dry.     Findings: No rash.  Neurological:     General: No focal deficit present.     Mental Status: She is alert. Mental status is at baseline.  Psychiatric:        Mood and Affect: Mood normal.        Behavior: Behavior normal.       Results for orders placed or performed in visit on 04/01/22  POCT glycosylated hemoglobin (Hb A1C)  Result Value Ref Range   Hemoglobin A1C 5.1 4.0 - 5.6 %   HbA1c POC (<> result, manual entry)     HbA1c, POC (prediabetic range)     HbA1c, POC (controlled diabetic range)      Assessment & Plan:   Problem List Items Addressed This Visit     Health maintenance examination - Primary (Chronic)    Preventative protocols reviewed and updated unless pt declined. Discussed healthy diet and lifestyle.       Mixed stress and urge incontinence    Stable period on oxybutynin XL. Appreciate urology care.        Hyperlipidemia    Chronic, deteriorated. Reviewed dietary choices to improve cholesterol levels. Low chol diet handout provided. Strong fmhx premature CAD - check lipoprotein a levels next labs.  The 10-year ASCVD risk score (Arnett DK, et al., 2019) is: 1%   Values used to calculate the score:     Age: 89 years     Sex: Female     Is Non-Hispanic African American: No  Diabetic: No     Tobacco smoker: No     Systolic Blood Pressure: 448 mmHg     Is BP treated: No     HDL Cholesterol: 48.8 mg/dL     Total Cholesterol: 229 mg/dL       ASCUS of cervix with negative high risk HPV    rec call and schedule GYN visit.       Hyperglycemia    Update POC A1c.       Relevant Orders   POCT glycosylated hemoglobin (Hb A1C) (Completed)   Family history of premature CAD    Consider Lp(a) next labs.       Other Visit Diagnoses     Special screening for malignant neoplasms, colon       Relevant Orders   Fecal occult blood, imunochemical   Need for Tdap vaccination       Relevant Orders   Tdap vaccine greater than or equal to 7yo IM (Completed)        No orders of the defined types were placed in this encounter.  Orders Placed This Encounter  Procedures   Fecal occult blood, imunochemical    Standing Status:   Future    Standing Expiration Date:   04/02/2023   Tdap vaccine greater than or equal to 7yo IM   POCT glycosylated hemoglobin (Hb A1C)    Patient instructions: Tdap today  Fingerstick a1c today.  Call to schedule mammogram at your convenience: [facility] at [facility] [phone] Call to schedule well woman exam for repeat pap smear.  Pass by lab to pick up stool kit.  Return as needed or in 1 year for next physical   Follow up plan: Return in about 1 year (around 04/02/2023) for annual exam, prior fasting for blood work.  Ria Bush, MD

## 2022-04-01 NOTE — Assessment & Plan Note (Signed)
rec call and schedule GYN visit.

## 2022-04-01 NOTE — Assessment & Plan Note (Signed)
Chronic, deteriorated. Reviewed dietary choices to improve cholesterol levels. Low chol diet handout provided. Strong fmhx premature CAD - check lipoprotein a levels next labs.  The 10-year ASCVD risk score (Arnett DK, et al., 2019) is: 1%   Values used to calculate the score:     Age: 46 years     Sex: Female     Is Non-Hispanic African American: No     Diabetic: No     Tobacco smoker: No     Systolic Blood Pressure: 937 mmHg     Is BP treated: No     HDL Cholesterol: 48.8 mg/dL     Total Cholesterol: 229 mg/dL

## 2022-04-01 NOTE — Assessment & Plan Note (Signed)
Stable period on oxybutynin XL. Appreciate urology care.

## 2022-04-01 NOTE — Assessment & Plan Note (Signed)
Preventative protocols reviewed and updated unless pt declined. Discussed healthy diet and lifestyle.  

## 2022-06-20 ENCOUNTER — Other Ambulatory Visit: Payer: Self-pay | Admitting: Family Medicine

## 2022-06-20 DIAGNOSIS — F4329 Adjustment disorder with other symptoms: Secondary | ICD-10-CM

## 2022-06-26 ENCOUNTER — Other Ambulatory Visit: Payer: Self-pay | Admitting: Family Medicine

## 2022-06-26 DIAGNOSIS — F4329 Adjustment disorder with other symptoms: Secondary | ICD-10-CM

## 2022-06-29 NOTE — Telephone Encounter (Signed)
Duplicate request. Rx sent on 06/26/22, #90/3 to [facility].

## 2022-09-24 ENCOUNTER — Other Ambulatory Visit: Payer: Self-pay | Admitting: Radiology

## 2022-09-24 DIAGNOSIS — Z1211 Encounter for screening for malignant neoplasm of colon: Secondary | ICD-10-CM

## 2022-09-25 ENCOUNTER — Telehealth: Payer: Self-pay

## 2022-09-25 DIAGNOSIS — R197 Diarrhea, unspecified: Secondary | ICD-10-CM

## 2022-09-25 DIAGNOSIS — R195 Other fecal abnormalities: Secondary | ICD-10-CM

## 2022-09-25 LAB — FECAL OCCULT BLOOD, IMMUNOCHEMICAL: Fecal Occult Bld: POSITIVE — AB

## 2022-09-25 NOTE — Telephone Encounter (Signed)
Holly with [facility] called report on + ifob; Holly said OK to send to Dr G for his return on 09/29/22. Sending to Dr G and G pool.  

## 2022-09-27 NOTE — Telephone Encounter (Addendum)
Plz notify pt that iFOB stool test returned positive for hidden blood. Has she had any active issue with hemorrhoids? If so, rec rpt once no hemorrhoid bleed.  If no hemorrhoid noted, recommend referral to GI to discuss possible colonoscopy.

## 2022-09-28 NOTE — Telephone Encounter (Signed)
Patient notified as instructed by telephone and verbalized understanding. Patient stated that she has not had any problems with hemorrhoids. Patient stated that she has been having diarrhea off and on for about 2 months. Patient denies any abdominal pain or rectal bleeding. Patient stated that she prefers to see a GI doctor in [place].  Patient was given ER precautions and she verbalized understanding. Patient was advised that she will hear from one of the referral coordinators to get her set up with GI.

## 2022-09-29 NOTE — Telephone Encounter (Signed)
Refer to [facility].

## 2022-09-29 NOTE — Addendum Note (Signed)
Addended by: Eustaquio Boyden on: 09/29/2022 08:41 AM   Modules accepted: Orders

## 2023-01-04 ENCOUNTER — Encounter: Payer: Self-pay | Admitting: Physician Assistant

## 2023-01-04 ENCOUNTER — Ambulatory Visit: Payer: BC Managed Care – PPO | Admitting: Physician Assistant

## 2023-01-04 VITALS — BP 128/85 | HR 67 | Wt 155.0 lb

## 2023-01-04 DIAGNOSIS — N3946 Mixed incontinence: Secondary | ICD-10-CM | POA: Diagnosis not present

## 2023-01-04 LAB — BLADDER SCAN AMB NON-IMAGING

## 2023-01-04 MED ORDER — OXYBUTYNIN CHLORIDE ER 10 MG PO TB24: 10.0000 mg | ORAL_TABLET | Freq: Every day | ORAL | 11 refills | Status: DC

## 2023-01-04 NOTE — Progress Notes (Signed)
01/04/2023 2:51 PM   Jennifer Shea June 01, 1976 [id number]  CC: Chief Complaint  Patient presents with   Follow-up   HPI: Jennifer Shea is a 47 y.o. female with PMH OAB wet with mixed stress and urge incontinence on oxybutynin XL 10 mg daily who presents today for annual follow-up.   Today she reports her urge incontinence is well-controlled on oxybutynin without constipation, dry mouth, or dry eye.  However, she and her husband have started a new exercise regimen within the past 2 to 3 months that has exacerbated her stress incontinence.  She is very frustrated with the stress incontinence and it is limiting her activity.  She was previously unable to complete pelvic floor PT due to poor insurance coverage which made it cost prohibitive.  PVR 0 mL.  PMH: Past Medical History:  Diagnosis Date   Acute cystitis without hematuria 02/03/2016   Acute sinusitis 11/03/2017   Hepatomegaly 09/16/2017   Mixed stress and urge incontinence 06/27/2008   Pedal edema 10/30/2020   Skin lumps 10/30/2020    Surgical History: Past Surgical History:  Procedure Laterality Date   TUBAL LIGATION  2002    Home Medications:  Allergies as of 01/04/2023   No Known Allergies      Medication List        Accurate as of January 04, 2023  2:51 PM. If you have any questions, ask your nurse or doctor.          CRANBERRY PO Take by mouth.   oxybutynin 10 MG 24 hr tablet Commonly known as: DITROPAN-XL Take 1 tablet (10 mg total) by mouth daily.   sertraline 50 MG tablet Commonly known as: ZOLOFT TAKE 1 TABLET BY MOUTH EVERY DAY        Allergies:  No Known Allergies  Family History: Family History  Problem Relation Age of Onset   Hypertension Father    CAD Father 32       MI   Cancer Father 70       lung (remote smoker)   CAD Brother 38       MI   Cancer Maternal Grandfather        ?colon vs pancreas   Cancer Paternal Uncle        ?stomach vs pancreas   Prostate cancer Neg Hx     Bladder Cancer Neg Hx    Kidney cancer Neg Hx     Social History:   reports that she has never smoked. She has been exposed to tobacco smoke. She has never used smokeless tobacco. She reports that she does not drink alcohol and does not use drugs.  Physical Exam: BP 128/85   Pulse 67   Wt 155 lb (70.3 kg)   BMI 28.35 kg/m   Constitutional:  Alert and oriented, no acute distress, nontoxic appearing HEENT: [place], AT Cardiovascular: No clubbing, cyanosis, or edema Respiratory: Normal respiratory effort, no increased work of breathing Skin: No rashes, bruises or suspicious lesions Neurologic: Grossly intact, no focal deficits, moving all 4 extremities Psychiatric: Normal mood and affect  Laboratory Data: Results for orders placed or performed in visit on 01/04/23  Bladder Scan (Post Void Residual) in office  Result Value Ref Range   Scan Result 0ml    Assessment & Plan:   1. Mixed stress and urge incontinence Urge incontinence well-controlled on oxybutynin, however stress incontinence has become more bothersome.  We discussed various options including referral for pelvic floor PT versus evaluation by Dr.  MacDiarmid for consideration of surgical intervention.  She prefers the latter; we will schedule her for follow-up with him. - Bladder Scan (Post Void Residual) in office - oxybutynin (DITROPAN-XL) 10 MG 24 hr tablet; Take 1 tablet (10 mg total) by mouth daily.  Dispense: 30 tablet; Refill: 11   Return for Stress incontinence eval with Dr. Sherron Monday.  Carman Ching, PA-C  [facility] [address] [place], [place] [address] [phone]

## 2023-01-18 ENCOUNTER — Ambulatory Visit (INDEPENDENT_AMBULATORY_CARE_PROVIDER_SITE_OTHER): Payer: BC Managed Care – PPO | Admitting: Urology

## 2023-01-18 ENCOUNTER — Encounter: Payer: Self-pay | Admitting: Urology

## 2023-01-18 VITALS — BP 137/84 | HR 75 | Wt 155.0 lb

## 2023-01-18 DIAGNOSIS — N3946 Mixed incontinence: Secondary | ICD-10-CM | POA: Diagnosis not present

## 2023-01-18 LAB — URINALYSIS, COMPLETE
Bilirubin, UA: NEGATIVE
Glucose, UA: NEGATIVE
Nitrite, UA: POSITIVE — AB
Specific Gravity, UA: >1.03 — ABNORMAL HIGH (ref 1.005–1.030)
Urobilinogen, Ur: 0.2 mg/dL (ref 0.2–1.0)
pH, UA: 5.5 (ref 5.0–7.5)

## 2023-01-18 LAB — MICROSCOPIC EXAMINATION: Epithelial Cells (non renal): >10 /hpf — AB (ref 0–10)

## 2023-01-18 NOTE — Progress Notes (Signed)
01/18/2023 9:38 AM   Jennifer Shea 1975-10-21 [id number]  Referring provider: Eustaquio Boyden, MD [address] [place],  [place] [address]  Chief Complaint  Patient presents with   Urinary Incontinence    HPI: SN: Mixed incontinence treated with oxybutynin.  Recently seen doing well on oxybutynin but started to have stress incontinence especially with exercising.  Physical therapy was limited by poor insurance coverage.  Today Patient urgent continence is much better on oxybutynin over the last 2 years.  She leaks with coughing sneezing exercise and working in the yard.  She wears 2-3 pads a day that are damp but can be more wet if she is active  She voids every 2-3 hours gets up twice a night.  Reports good flow and feels empty  Has not had a hysterectomy.  She does get urinary tract infections but does not on cranberry  Bowel movements normal.  No neurologic issues.  No history of kidney stones or bladder surgery   PMH: Past Medical History:  Diagnosis Date   Acute cystitis without hematuria 02/03/2016   Acute sinusitis 11/03/2017   Hepatomegaly 09/16/2017   Mixed stress and urge incontinence 06/27/2008   Pedal edema 10/30/2020   Skin lumps 10/30/2020    Surgical History: Past Surgical History:  Procedure Laterality Date   TUBAL LIGATION  2002    Home Medications:  Allergies as of 01/18/2023   No Known Allergies      Medication List        Accurate as of January 18, 2023  9:38 AM. If you have any questions, ask your nurse or doctor.          CRANBERRY PO Take by mouth.   oxybutynin 10 MG 24 hr tablet Commonly known as: DITROPAN-XL Take 1 tablet (10 mg total) by mouth daily.   sertraline 50 MG tablet Commonly known as: ZOLOFT TAKE 1 TABLET BY MOUTH EVERY DAY        Allergies: No Known Allergies  Family History: Family History  Problem Relation Age of Onset   Hypertension Father    CAD Father 30       MI   Cancer Father 46        lung (remote smoker)   CAD Brother 69       MI   Cancer Maternal Grandfather        ?colon vs pancreas   Cancer Paternal Uncle        ?stomach vs pancreas   Prostate cancer Neg Hx    Bladder Cancer Neg Hx    Kidney cancer Neg Hx     Social History:  reports that she has never smoked. She has been exposed to tobacco smoke. She has never used smokeless tobacco. She reports that she does not drink alcohol and does not use drugs.  ROS:                                        Physical Exam: BP 137/84   Pulse 75   Wt 70.3 kg   BMI 28.35 kg/m   Constitutional:  Alert and oriented, No acute distress. HEENT: [place] AT, moist mucus membranes.  Trachea midline, no masses. Cardiovascular: No clubbing, cyanosis, or edema. Respiratory: Normal respiratory effort, no increased work of breathing. GI: Abdomen is soft, nontender, nondistended, no abdominal masses GU: On pelvic examination she had grade 2 hypermobility  the bladder neck and negative cough test with a good cough.  She had some rotational descent of a small grade 1 cystocele but no significant central defect.  No hinging effect. Skin: No rashes, bruises or suspicious lesions. Lymph: No cervical or inguinal adenopathy. Neurologic: Grossly intact, no focal deficits, moving all 4 extremities. Psychiatric: Normal mood and affect.  Laboratory Data: No results found for: "WBC", "HGB", "HCT", "MCV", "PLT"  Lab Results  Component Value Date   CREATININE 0.75 03/25/2022    No results found for: "PSA"  No results found for: "TESTOSTERONE"  Lab Results  Component Value Date   HGBA1C 5.1 04/01/2022    Urinalysis    Component Value Date/Time   COLORURINE lt. yellow 04/01/2010 1613   APPEARANCEUR Clear 11/20/2020 1306   LABSPEC 1.015 04/01/2010 1613   PHURINE 8.0 04/01/2010 1613   GLUCOSEU Negative 11/20/2020 1306   HGBUR large 04/01/2010 1613   BILIRUBINUR neg 08/19/2021 1308   BILIRUBINUR Negative  11/20/2020 1306   PROTEINUR Negative 08/19/2021 1308   PROTEINUR Negative 11/20/2020 1306   UROBILINOGEN 0.2 08/19/2021 1308   UROBILINOGEN 0.2 04/01/2010 1613   NITRITE neg 08/19/2021 1308   NITRITE Negative 11/20/2020 1306   NITRITE negative 04/01/2010 1613   LEUKOCYTESUR Small (1+) (A) 08/19/2021 1308   LEUKOCYTESUR Negative 11/20/2020 1306    Pertinent Imaging: Urine reviewed.  Chart reviewed.  Bladder scan 0 mL  Assessment & Plan: Patient has mild mixed incontinence.  Stress component affecting quality of life and limiting activity.  Role of urodynamics discussed.  Requirement for possible referral in the future discussed.  I thought it was prudent to have patient come back in for the results before referral and she agreed.  1. Mixed stress and urge incontinence  - Urinalysis, Complete   No follow-ups on file.  Martina Sinner, MD  [facility] [address] [place], [place] [address] [phone]

## 2023-01-21 ENCOUNTER — Ambulatory Visit: Payer: BC Managed Care – PPO | Admitting: Gastroenterology

## 2023-01-25 ENCOUNTER — Other Ambulatory Visit: Payer: Self-pay | Admitting: *Deleted

## 2023-01-25 MED ORDER — NITROFURANTOIN MACROCRYSTAL 100 MG PO CAPS: 100.0000 mg | ORAL_CAPSULE | Freq: Two times a day (BID) | ORAL | 0 refills | Status: AC

## 2023-01-27 ENCOUNTER — Telehealth: Payer: Self-pay | Admitting: Gastroenterology

## 2023-01-27 NOTE — Telephone Encounter (Signed)
Patient wants to change due to weather okay by provider.

## 2023-01-27 NOTE — Telephone Encounter (Signed)
Called patient back but had to leave her another voicemail letting her know that I changed her appointment since she did not wanted to come in tomorrow due to the weather. Appointment changed to 02/11/2023 at 2:45 PM with Dr. Tobi Bastos.

## 2023-01-28 ENCOUNTER — Ambulatory Visit: Payer: BC Managed Care – PPO | Admitting: Gastroenterology

## 2023-02-11 ENCOUNTER — Ambulatory Visit: Payer: BC Managed Care – PPO | Admitting: Gastroenterology

## 2023-02-11 ENCOUNTER — Other Ambulatory Visit: Payer: Self-pay

## 2023-02-11 ENCOUNTER — Encounter: Payer: Self-pay | Admitting: Gastroenterology

## 2023-02-11 VITALS — BP 129/80 | HR 67 | Temp 98.1°F | Ht 62.0 in | Wt 154.0 lb

## 2023-02-11 DIAGNOSIS — R197 Diarrhea, unspecified: Secondary | ICD-10-CM

## 2023-02-11 DIAGNOSIS — R195 Other fecal abnormalities: Secondary | ICD-10-CM | POA: Diagnosis not present

## 2023-02-11 MED ORDER — NA SULFATE-K SULFATE-MG SULF 17.5-3.13-1.6 GM/177ML PO SOLN: 354.0000 mL | Freq: Once | ORAL | 0 refills | Status: AC

## 2023-02-11 NOTE — Progress Notes (Signed)
Wyline Mood MD, MRCP(U.K) [address]  [address]  [place], [place] [address]  Main: [phone]  Fax: [phone]   Gastroenterology Consultation  Referring Provider:     Eustaquio Boyden, MD Primary Care Physician:  Eustaquio Boyden, MD Primary Gastroenterologist:  Dr. Wyline Mood  Reason for Consultation: Diarrhea and stool occult test is positive        HPI:   Jennifer Shea is a 47 y.o. y/o female referred for consultation & management  by Dr. Eustaquio Boyden, MD.     Been referred for a positive stool occult test in April 2024.  No other recent labs available.  Blood work.  She says this in January 2024 she is been having diarrhea.  A few episodes a day.  Nonbloody.  No abdominal pain.  No NSAID use.  No gas and bloating.  She did consume some soda which she is stopped but despite that she continues to have diarrhea.  No unintentional weight loss she has lost weight intentionally.  Never a colonoscopy no family Struve colon cancer or polyps.  Past Medical History:  Diagnosis Date   Acute cystitis without hematuria 02/03/2016   Acute sinusitis 11/03/2017   Hepatomegaly 09/16/2017   Mixed stress and urge incontinence 06/27/2008   Pedal edema 10/30/2020   Skin lumps 10/30/2020    Past Surgical History:  Procedure Laterality Date   TUBAL LIGATION  2002    Prior to Admission medications   Medication Sig Start Date End Date Taking? Authorizing Provider  CRANBERRY PO Take by mouth.    [provider]  oxybutynin (DITROPAN-XL) 10 MG 24 hr tablet Take 1 tablet (10 mg total) by mouth daily. 01/04/23   Carman Ching, PA-C  sertraline (ZOLOFT) 50 MG tablet TAKE 1 TABLET BY MOUTH EVERY DAY 06/26/22   Eustaquio Boyden, MD    Family History  Problem Relation Age of Onset   Hypertension Father    CAD Father 11       MI   Cancer Father 56       lung (remote smoker)   CAD Brother 1       MI   Cancer Maternal Grandfather        ?colon vs pancreas    Cancer Paternal Uncle        ?stomach vs pancreas   Prostate cancer Neg Hx    Bladder Cancer Neg Hx    Kidney cancer Neg Hx      Social History   Tobacco Use   Smoking status: Never    Passive exposure: Past   Smokeless tobacco: Never  Vaping Use   Vaping status: Never Used  Substance Use Topics   Alcohol use: Never   Drug use: Never    Allergies as of 02/11/2023   (No Known Allergies)    Review of Systems:    All systems reviewed and negative except where noted in HPI.   Physical Exam:  BP 129/80   Pulse 67   Temp 98.1 F (36.7 C) (Oral)   Ht 5\' 2"  (1.575 m)   Wt 154 lb (69.9 kg)   BMI 28.17 kg/m  No LMP recorded. Psych:  Alert and cooperative. Normal mood and affect. General:   Alert,  Well-developed, well-nourished, pleasant and cooperative in NAD Head:  Normocephalic and atraumatic. Eyes:  Sclera clear, no icterus.   Conjunctiva pink. Ears:  Normal auditory acuity.   Neurologic:  Alert and oriented x3;  grossly normal neurologically. Psych:  Alert  and cooperative. Normal mood and affect.  Imaging Studies: No results found.  Assessment and Plan:   Jennifer Shea is a 47 y.o. y/o female has been referred for positive stool occult test positive which was performed for colon cancer screening.  Since January having nonbloody diarrhea has been on sertraline for migraine.  Plan 1.  Colonoscopy she is on sertraline will rule out microscopic colitis with biopsies 2.  Stool studies, celiac serology 3.  At follow-up visit she continues to have diarrhea we will give her a course of Xifaxan for IBS diarrhea   I have discussed alternative options, risks & benefits,  which include, but are not limited to, bleeding, infection, perforation,respiratory complication & drug reaction.  The patient agrees with this plan & written consent will be obtained.     Follow up in 8 weeks with Celso Amy  Dr Wyline Mood MD,MRCP(U.K)   \

## 2023-02-13 LAB — CELIAC DISEASE AB SCREEN W/RFX
Antigliadin Abs, IgA: 2 U (ref 0–19)
IgA/Immunoglobulin A, Serum: 227 mg/dL (ref 87–352)
Transglutaminase IgA: <2 U/mL (ref 0–3)

## 2023-02-19 LAB — GI PROFILE, STOOL, PCR

## 2023-02-19 LAB — C DIFFICILE, CYTOTOXIN B

## 2023-02-19 LAB — C DIFFICILE TOXINS A+B W/RFLX: C difficile Toxins A+B, EIA: NEGATIVE

## 2023-02-24 ENCOUNTER — Ambulatory Visit: Payer: BC Managed Care – PPO | Admitting: Certified Registered"

## 2023-02-24 ENCOUNTER — Ambulatory Visit
Admission: RE | Admit: 2023-02-24 | Discharge: 2023-02-24 | Disposition: A | Discharge status: Home / Self Care | Payer: BC Managed Care – PPO | Attending: Gastroenterology | Admitting: Gastroenterology

## 2023-02-24 ENCOUNTER — Encounter
Admission: RE | Disposition: A | Discharge status: Home / Self Care | Payer: Self-pay | Source: Home / Self Care | Attending: Gastroenterology

## 2023-02-24 DIAGNOSIS — K52832 Lymphocytic colitis: Secondary | ICD-10-CM | POA: Insufficient documentation

## 2023-02-24 DIAGNOSIS — K648 Other hemorrhoids: Secondary | ICD-10-CM | POA: Diagnosis not present

## 2023-02-24 DIAGNOSIS — K640 First degree hemorrhoids: Secondary | ICD-10-CM | POA: Insufficient documentation

## 2023-02-24 DIAGNOSIS — K5289 Other specified noninfective gastroenteritis and colitis: Secondary | ICD-10-CM | POA: Diagnosis not present

## 2023-02-24 DIAGNOSIS — R195 Other fecal abnormalities: Secondary | ICD-10-CM

## 2023-02-24 DIAGNOSIS — R197 Diarrhea, unspecified: Secondary | ICD-10-CM

## 2023-02-24 DIAGNOSIS — K625 Hemorrhage of anus and rectum: Secondary | ICD-10-CM | POA: Diagnosis present

## 2023-02-24 HISTORY — PX: BIOPSY: SHX5522

## 2023-02-24 HISTORY — PX: COLONOSCOPY WITH PROPOFOL: SHX5780

## 2023-02-24 LAB — POCT PREGNANCY, URINE: Preg Test, Ur: NEGATIVE

## 2023-02-24 SURGERY — COLONOSCOPY WITH PROPOFOL
Anesthesia: General

## 2023-02-24 MED ORDER — PROPOFOL 10 MG/ML IV BOLUS
INTRAVENOUS | Status: DC | PRN
  Administered 2023-02-24: 70 mg via INTRAVENOUS
  Administered 2023-02-24: 10 mg via INTRAVENOUS
  Administered 2023-02-24: 20 mg via INTRAVENOUS

## 2023-02-24 MED ORDER — SODIUM CHLORIDE 0.9 % IV SOLN
INTRAVENOUS | Status: DC
  Administered 2023-02-24: 20 mL/h via INTRAVENOUS

## 2023-02-24 MED ORDER — LIDOCAINE HCL (CARDIAC) PF 100 MG/5ML IV SOSY
PREFILLED_SYRINGE | INTRAVENOUS | Status: DC | PRN
  Administered 2023-02-24: 50 mg via INTRAVENOUS

## 2023-02-24 MED ORDER — PROPOFOL 500 MG/50ML IV EMUL
INTRAVENOUS | Status: DC | PRN
  Administered 2023-02-24: 150 ug/kg/min via INTRAVENOUS

## 2023-02-24 NOTE — H&P (Signed)
Wyline Mood, MD [address], [address], [place], [place], [address] [address], [address], [place], [place], [address] Phone: [phone]  Fax: [phone]  Primary Care Physician:  Eustaquio Boyden, MD   Pre-Procedure History & Physical: HPI:  Jennifer Shea is a 47 y.o. female is here for an colonoscopy.   Past Medical History:  Diagnosis Date   Acute cystitis without hematuria 02/03/2016   Acute sinusitis 11/03/2017   Hepatomegaly 09/16/2017   Mixed stress and urge incontinence 06/27/2008   Pedal edema 10/30/2020   Skin lumps 10/30/2020    Past Surgical History:  Procedure Laterality Date   TUBAL LIGATION  2002    Prior to Admission medications   Medication Sig Start Date End Date Taking? Authorizing Provider  CRANBERRY PO Take by mouth.    [provider]  oxybutynin (DITROPAN-XL) 10 MG 24 hr tablet Take 1 tablet (10 mg total) by mouth daily. 01/04/23   Carman Ching, PA-C  sertraline (ZOLOFT) 50 MG tablet TAKE 1 TABLET BY MOUTH EVERY DAY 06/26/22   Eustaquio Boyden, MD    Allergies as of 02/11/2023   (No Known Allergies)    Family History  Problem Relation Age of Onset   Hypertension Father    CAD Father 41       MI   Cancer Father 27       lung (remote smoker)   CAD Brother 73       MI   Cancer Maternal Grandfather        ?colon vs pancreas   Cancer Paternal Uncle        ?stomach vs pancreas   Prostate cancer Neg Hx    Bladder Cancer Neg Hx    Kidney cancer Neg Hx     Social History   Socioeconomic History   Marital status: Married    Spouse name: Not on file   Number of children: 2   Years of education: Not on file   Highest education level: Not on file  Occupational History   Not on file  Tobacco Use   Smoking status: Never    Passive exposure: Past   Smokeless tobacco: Never  Vaping Use   Vaping status: Never Used  Substance and Sexual Activity   Alcohol use: Never   Drug use: Never   Sexual activity: Yes     Birth control/protection: Surgical    Comment: Tubal  Other Topics Concern   Not on file  Social History Narrative   Lives with husband and FIL   Grown children   Occ: Banker   Activity: walking dog   Nutrition: good water, fruits/vegetables daily    Social Determinants of Health   Financial Resource Strain: Not on file  Food Insecurity: Not on file  Transportation Needs: Not on file  Physical Activity: Not on file  Stress: Not on file  Social Connections: Not on file  Intimate Partner Violence: Not on file    Review of Systems: See HPI, otherwise negative ROS  Physical Exam: There were no vitals taken for this visit. General:   Alert,  pleasant and cooperative in NAD Head:  Normocephalic and atraumatic. Neck:  Supple; no masses or thyromegaly. Lungs:  Clear throughout to auscultation, normal respiratory effort.    Heart:  +S1, +S2, Regular rate and rhythm, No edema. Abdomen:  Soft, nontender and nondistended. Normal bowel sounds, without guarding, and without rebound.   Neurologic:  Alert and  oriented x4;  grossly normal  neurologically.  Impression/Plan: Jennifer Shea is here for an colonoscopy to be performed for blood in stool Risks, benefits, limitations, and alternatives regarding  colonoscopy have been reviewed with the patient.  Questions have been answered.  All parties agreeable.   Wyline Mood, MD  02/24/2023, 11:27 AM

## 2023-02-24 NOTE — Anesthesia Procedure Notes (Signed)
Procedure Name: MAC Date/Time: 02/24/2023 11:48 AM  Performed by: Cheral Bay, CRNAPre-anesthesia Checklist: Patient identified, Emergency Drugs available, Suction available, Patient being monitored and Timeout performed Patient Re-evaluated:Patient Re-evaluated prior to induction Oxygen Delivery Method: Nasal cannula Induction Type: IV induction Placement Confirmation: positive ETCO2 and CO2 detector

## 2023-02-24 NOTE — Transfer of Care (Signed)
Immediate Anesthesia Transfer of Care Note  Patient: Jennifer Shea  Procedure(s) Performed: COLONOSCOPY WITH PROPOFOL  Patient Location: PACU and Endoscopy Unit  Anesthesia Type:General  Level of Consciousness: sedated  Airway & Oxygen Therapy: Patient Spontanous Breathing  Post-op Assessment: Report given to RN and Post -op Vital signs reviewed and stable  Post vital signs: Reviewed and stable  Last Vitals:  Vitals Value Taken Time  BP    Temp    Pulse    Resp    SpO2      Last Pain:  Vitals:   02/24/23 1132  TempSrc: Temporal  PainSc: 0-No pain         Complications: No notable events documented.

## 2023-02-24 NOTE — Op Note (Signed)
[facility] Gastroenterology Patient Name: Jennifer Shea Procedure Date: 02/24/2023 11:33 AM MRN: [id number] Account #: [id number] Date of Birth: [birth year] Admit Type: Outpatient Age: 47 Room: [facility] ENDO ROOM 3 Gender: Female Note Status: Finalized Instrument Name: Prentice Docker [id number] Procedure:             Colonoscopy Indications:           Rectal bleeding Providers:             Wyline Mood MD, MD Referring MD:          Eustaquio Boyden (Referring MD) Medicines:             Monitored Anesthesia Care Complications:         No immediate complications. Procedure:             Pre-Anesthesia Assessment:                        - Prior to the procedure, a History and Physical was                         performed, and patient medications, allergies and                         sensitivities were reviewed. The patient's tolerance                         of previous anesthesia was reviewed.                        - The risks and benefits of the procedure and the                         sedation options and risks were discussed with the                         patient. All questions were answered and informed                         consent was obtained.                        - ASA Grade Assessment: II - A patient with mild                         systemic disease.                        After obtaining informed consent, the colonoscope was                         passed under direct vision. Throughout the procedure,                         the patient's blood pressure, pulse, and oxygen                         saturations were monitored continuously. The                         Colonoscope was introduced through the anus and  advanced to the the cecum, identified by the                         appendiceal orifice. The colonoscopy was performed                         with ease. The patient tolerated the procedure well.                         The  quality of the bowel preparation was excellent.                         The ileocecal valve, appendiceal orifice, and rectum                         were photographed. Findings:      The perianal and digital rectal examinations were normal.      Non-bleeding internal hemorrhoids were found during retroflexion. The       hemorrhoids were medium-sized and Grade I (internal hemorrhoids that do       not prolapse).      Normal mucosa was found in the entire colon. Biopsies for histology were       taken with a cold forceps for evaluation of microscopic colitis.      The exam was otherwise without abnormality on direct and retroflexion       views. Impression:            - Non-bleeding internal hemorrhoids.                        - Normal mucosa in the entire examined colon. Biopsied.                        - The examination was otherwise normal on direct and                         retroflexion views. Recommendation:        - Discharge patient to home (with escort).                        - Resume previous diet.                        - Continue present medications.                        - Await pathology results.                        - Repeat colonoscopy in 10 years for screening                         purposes. Procedure Code(s):     --- Professional ---                        [phone], Colonoscopy, flexible; with biopsy, single or                         multiple Diagnosis Code(s):     --- Professional ---  K64.0, First degree hemorrhoids                        K62.5, Hemorrhage of anus and rectum CPT copyright 2022 American Medical Association. All rights reserved. The codes documented in this report are preliminary and upon coder review may  be revised to meet current compliance requirements. Wyline Mood, MD Wyline Mood MD, MD 02/24/2023 12:04:20 PM This report has been signed electronically. Number of Addenda: 0 Note Initiated On: 02/24/2023 11:33 AM Scope  Withdrawal Time: 0 hours 8 minutes 53 seconds  Total Procedure Duration: 0 hours 12 minutes 8 seconds  Estimated Blood Loss:  Estimated blood loss: none.      [facility]

## 2023-02-25 ENCOUNTER — Encounter: Payer: Self-pay | Admitting: Gastroenterology

## 2023-02-25 NOTE — Anesthesia Postprocedure Evaluation (Signed)
Anesthesia Post Note  Patient: Jennifer Shea  Procedure(s) Performed: COLONOSCOPY WITH PROPOFOL  Patient location during evaluation: PACU Anesthesia Type: General Level of consciousness: awake and alert Pain management: pain level controlled Vital Signs Assessment: post-procedure vital signs reviewed and stable Respiratory status: spontaneous breathing, nonlabored ventilation, respiratory function stable and patient connected to nasal cannula oxygen Cardiovascular status: blood pressure returned to baseline and stable Postop Assessment: no apparent nausea or vomiting Anesthetic complications: no  No notable events documented.   Last Vitals:  Vitals:   02/24/23 1215 02/24/23 1225  BP:    Pulse: 66 75  Resp:    Temp:    SpO2: 100% 100%    Last Pain:  Vitals:   02/24/23 1225  TempSrc:   PainSc: 0-No pain                 Stephanie Coup

## 2023-02-25 NOTE — Anesthesia Preprocedure Evaluation (Signed)
Anesthesia Evaluation  Patient identified by MRN, date of birth, ID band Patient awake    Reviewed: Allergy & Precautions, NPO status , Patient's Chart, lab work & pertinent test results  Airway Mallampati: III  TM Distance: >3 FB Neck ROM: full    Dental  (+) Chipped   Pulmonary neg pulmonary ROS   Pulmonary exam normal        Cardiovascular negative cardio ROS Normal cardiovascular exam     Neuro/Psych  PSYCHIATRIC DISORDERS      negative neurological ROS     GI/Hepatic negative GI ROS, Neg liver ROS,,,  Endo/Other  negative endocrine ROS    Renal/GU negative Renal ROS  negative genitourinary   Musculoskeletal   Abdominal   Peds  Hematology negative hematology ROS (+)   Anesthesia Other Findings Past Medical History: 02/03/2016: Acute cystitis without hematuria 11/03/2017: Acute sinusitis 09/16/2017: Hepatomegaly 06/27/2008: Mixed stress and urge incontinence 10/30/2020: Pedal edema 10/30/2020: Skin lumps  Past Surgical History: 2002: TUBAL LIGATION  BMI    Body Mass Index: 27.00 kg/m      Reproductive/Obstetrics negative OB ROS                             Anesthesia Physical Anesthesia Plan  ASA: 2  Anesthesia Plan: General   Post-op Pain Management: Minimal or no pain anticipated   Induction: Intravenous  PONV Risk Score and Plan: 3 and Propofol infusion, TIVA and Ondansetron  Airway Management Planned: Nasal Cannula  Additional Equipment: None  Intra-op Plan:   Post-operative Plan:   Informed Consent: I have reviewed the patients History and Physical, chart, labs and discussed the procedure including the risks, benefits and alternatives for the proposed anesthesia with the patient or authorized representative who has indicated his/her understanding and acceptance.     Dental advisory given  Plan Discussed with: CRNA and Surgeon  Anesthesia Plan Comments:  (Discussed risks of anesthesia with patient, including possibility of difficulty with spontaneous ventilation under anesthesia necessitating airway intervention, PONV, and rare risks such as cardiac or respiratory or neurological events, and allergic reactions. Discussed the role of CRNA in patient's perioperative care. Patient understands.)       Anesthesia Quick Evaluation

## 2023-03-01 ENCOUNTER — Encounter: Payer: Self-pay | Admitting: Family Medicine

## 2023-03-01 DIAGNOSIS — K52832 Lymphocytic colitis: Secondary | ICD-10-CM | POA: Insufficient documentation

## 2023-03-02 NOTE — Progress Notes (Signed)
Pathology shows lymphocytic colitis FYI she is due to see you next month.  She is on sertraline which is highly associated with lymphocytic colitis and he may need to get in touch with her PCP to see if there is an alternative if the patient continues to have diarrhea

## 2023-03-04 ENCOUNTER — Telehealth: Payer: Self-pay

## 2023-03-04 MED ORDER — BUDESONIDE 3 MG PO CPEP: ORAL_CAPSULE | ORAL | 0 refills | Status: DC

## 2023-03-04 NOTE — Telephone Encounter (Signed)
Jennifer Shea also sent me a message letting me know that if the patient continued to have diarrhea, then she would treat her with Budesonide. So I then called the patient again and she stated that she still continued having diarrhea. Therefore, I told her that I would be sending her the prescription to her pharmacy so she could start taking. Patient agreed and had no further questions. I also let the patient know that I reached out to Dr. Sharen Hones letting him know about her medication Sertraline and what is possibly causing her diarrhea.

## 2023-03-04 NOTE — Telephone Encounter (Signed)
-----   Message from Celso Amy sent at 03/03/2023  3:26 PM EDT ----- Kandis Cocking, Please call patient and notify her colonoscopy by Dr. Tobi Bastos showed biopsies positive for lymphocytic microscopic colitis.  Per Dr. Tobi Bastos, she is on sertraline which is highly associated with lymphocytic colitis.  Recommend she get in touch with her PCP to see if she can stop sertraline and switch to a different medicine.  If she is having a lot of diarrhea, we can treat with budesonide 3 mg 3 capsules daily for 1 month, then 2 capsules daily for 1 month, then 1 capsule daily for 1 month.  She has follow-up OV with me 04/13/2023.  I am happy to see her sooner if needed. Celso Amy, PA-C ----- Message ----- From: Wyline Mood, MD Sent: 03/02/2023   2:45 PM EDT To: Celso Amy, PA-C  Pathology shows lymphocytic colitis FYI she is due to see you next month.  She is on sertraline which is highly associated with lymphocytic colitis and he may need to get in touch with her PCP to see if there is an alternative if the patient continue s to have diarrhea

## 2023-03-04 NOTE — Telephone Encounter (Signed)
Called Mrs. Jennifer Shea to let her know what Dr. Tobi Bastos wanted me to tell her. I let her know that I would be messaging her PCP to let him know that Sertraline could be the cause of her diarrhea. Patient understood and stated that she will also reach out to him. Patient had no further questions.

## 2023-03-04 NOTE — Addendum Note (Signed)
Addended by: Adela Ports on: 03/04/2023 10:05 AM   Modules accepted: Orders

## 2023-03-11 ENCOUNTER — Telehealth: Payer: Self-pay | Admitting: Family Medicine

## 2023-03-11 DIAGNOSIS — K52832 Lymphocytic colitis: Secondary | ICD-10-CM

## 2023-03-11 NOTE — Telephone Encounter (Signed)
Spoke with pt relaying Dr Timoteo Expose message. Pt verbalizes understanding and will check with ins co to see what is preferred. In the meantime, pt scheduled OV on9/25/24 at 4:00. Fyi to Dr Reece Agar.

## 2023-03-11 NOTE — Telephone Encounter (Signed)
Received message from [facility] Tobi Bastos) that pt had colonoscopy done for chronic diarrhea showing lymphocytic colitis. Concern sertraline contributing to this.  Plz call patient - would offer transition to different antidepressant. Could consider wellbutrin vs vortioxetine (depending on insurance coverage).  Would have her check with her insurance to see if vortioxetine (Trintellix) is covered. Would offer OV to review options

## 2023-03-16 ENCOUNTER — Telehealth: Payer: Self-pay | Admitting: Family Medicine

## 2023-03-16 NOTE — Telephone Encounter (Signed)
Pt called stating the new meds Dr. Reece Agar wanted pt to start taking isn't covered by ins, pt states she'll need the generic brand. Pt states she is currently taking sertraline (ZOLOFT) 50 MG tablet. Pt didn't provide the name of the new meds. Call back # [phone]

## 2023-03-17 ENCOUNTER — Encounter: Payer: Self-pay | Admitting: Family Medicine

## 2023-03-17 ENCOUNTER — Ambulatory Visit: Payer: BC Managed Care – PPO | Admitting: Family Medicine

## 2023-03-17 VITALS — BP 134/76 | HR 80 | Temp 97.5°F | Ht 62.0 in | Wt 145.1 lb

## 2023-03-17 DIAGNOSIS — N3946 Mixed incontinence: Secondary | ICD-10-CM | POA: Diagnosis not present

## 2023-03-17 DIAGNOSIS — K52832 Lymphocytic colitis: Secondary | ICD-10-CM | POA: Diagnosis not present

## 2023-03-17 DIAGNOSIS — F4329 Adjustment disorder with other symptoms: Secondary | ICD-10-CM | POA: Diagnosis not present

## 2023-03-17 NOTE — Assessment & Plan Note (Signed)
Persistent situational stressor. On sertraline 50mg  since 07/2021 without significant improvement in symptoms/situation, and it likely contributed to med related lymphocytic colitis.  Will taper off over the next 2-3 weeks - dropping to 25mg  daily then stop.  Reassess symptoms off medication. Discussed if worsening symptoms to let us know for psychology referral for counseling.

## 2023-03-17 NOTE — Telephone Encounter (Signed)
Will discuss at OV today.

## 2023-03-17 NOTE — Assessment & Plan Note (Signed)
Appreciate urology care Discussing sling.

## 2023-03-17 NOTE — Assessment & Plan Note (Signed)
Suspicious for med-induced (sertraline).  Will taper off SSRI as per above.  She already has improved on budesonide taper.

## 2023-03-17 NOTE — Patient Instructions (Addendum)
Taper off sertraline - take 1/2 tablet of 50mg  (25mg  dose) daily for the next 2-3 weeks then off.  If noticeably worsening stress, let me know.  If needed next step would be counselor.

## 2023-03-17 NOTE — Progress Notes (Signed)
Ph: [phone] Fax: [phone]   Patient ID: Jennifer Shea, female    DOB: 06-22-1976, 47 y.o.   MRN: [id number]  This visit was conducted in person.  BP 134/76   Pulse 80   Temp (!) 97.5 F (36.4 C) (Temporal)   Ht 5\' 2"  (1.575 m)   Wt 145 lb 2 oz (65.8 kg)   LMP 03/03/2023   SpO2 99%   BMI 26.54 kg/m    CC: discuss mood medications  Subjective:   HPI: Jennifer Shea is a 47 y.o. female presenting on 03/17/2023 for Medical Management of Chronic Issues (Wants to discuss other mood med options vs sertraline per GI suggestion due to colonoscopy recent results.)   Recent episode of chronic diarrhea s/p colonoscopy with biopsy showing lymphocytic colitis possibly related to chronic sertraline use. Currently on prolonged budesonide taper.  Trintellix is antidepressant least likely to cause lymphocytic colitis difficulty. However this is not covered by her insurance.   On zoloft 50mg  daily since early 2023 started for situational anxiety/depression.  She continues with stressful situation at home - FIL living at home with them - for the past 1.5 yrs.   She hasn't noticed much change at all since being on medication.   No fatigue, trouble sleeping, appetite changes. Still has hobbies she enjoys doing. No depressed mood.   Healthy diet and lifestyle changes - more regularly active at gym, walking 2 hours every lunch. Has cut down carbs.      Relevant past medical, surgical, family and social history reviewed and updated as indicated. Interim medical history since our last visit reviewed. Allergies and medications reviewed and updated. Outpatient Medications Prior to Visit  Medication Sig Dispense Refill   budesonide (ENTOCORT EC) 3 MG 24 hr capsule Take Budesonide 3 mg (3) capsules daily for 1 month, then (2) capsules daily for 1 month, then (1) capsule daily for 1 month. 180 capsule 0   CRANBERRY PO Take by mouth.     oxybutynin (DITROPAN-XL) 10 MG 24 hr tablet Take 1  tablet (10 mg total) by mouth daily. 30 tablet 11   sertraline (ZOLOFT) 50 MG tablet Take 0.5 tablets (25 mg total) by mouth daily.     sertraline (ZOLOFT) 50 MG tablet TAKE 1 TABLET BY MOUTH EVERY DAY 90 tablet 3   No facility-administered medications prior to visit.     Per HPI unless specifically indicated in ROS section below Review of Systems  Objective:  BP 134/76   Pulse 80   Temp (!) 97.5 F (36.4 C) (Temporal)   Ht 5\' 2"  (1.575 m)   Wt 145 lb 2 oz (65.8 kg)   LMP 03/03/2023   SpO2 99%   BMI 26.54 kg/m   Wt Readings from Last 3 Encounters:  03/17/23 145 lb 2 oz (65.8 kg)  02/24/23 147 lb 9.6 oz (67 kg)  02/11/23 154 lb (69.9 kg)      Physical Exam Vitals and nursing note reviewed.  Constitutional:      Appearance: Normal appearance. She is not ill-appearing.  Neurological:     Mental Status: She is alert.  Psychiatric:        Mood and Affect: Mood normal.        Behavior: Behavior normal.       Results for orders placed or performed during the hospital encounter of 02/24/23  Pregnancy, urine POC  Result Value Ref Range   Preg Test, Ur NEGATIVE NEGATIVE      03/17/2023  4:17 PM 04/01/2022   10:31 AM 09/17/2021   11:48 AM 08/19/2021   12:30 PM 12/25/2020    2:08 PM  Depression screen PHQ 2/9  Decreased Interest 0 0 0 0 0  Down, Depressed, Hopeless 0 0 0 1 0  PHQ - 2 Score 0 0 0 1 0  Altered sleeping  0 0    Tired, decreased energy  0 0    Change in appetite  0 0    Feeling bad or failure about yourself   0 0    Trouble concentrating  0 0    Moving slowly or fidgety/restless  0 0    Suicidal thoughts  0 0    PHQ-9 Score  0 0    Difficult doing work/chores  Not difficult at all Not difficult at all         03/17/2023    4:17 PM 09/17/2021   11:48 AM 08/19/2021   12:29 PM  GAD 7 : Generalized Anxiety Score  Nervous, Anxious, on Edge 0 1 3  Control/stop worrying 0 0 2  Worry too much - different things 0 0 2  Trouble relaxing 0 0 2  Restless 0 0  0  Easily annoyed or irritable 0 1 3  Afraid - awful might happen 0 0 0  Total GAD 7 Score 0 2 12  Anxiety Difficulty  Not difficult at all     Assessment & Plan:   Problem List Items Addressed This Visit     Mixed stress and urge incontinence    Appreciate urology care Discussing sling.       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 On sertraline 50mg  since 07/2021 without significant improvement in symptoms/situation, and it likely contributed to med related lymphocytic colitis.  Will taper off over the next 2-3 weeks - dropping to 25mg  daily then stop.  Reassess symptoms off medication. Discussed if worsening symptoms to let us know for psychology referral for counseling.      Relevant Medications   sertraline (ZOLOFT) 50 MG tablet   Lymphocytic colitis    Suspicious for med-induced (sertraline).  Will taper off SSRI as per above.  She already has improved on budesonide taper.         No orders of the defined types were placed in this encounter.   No orders of the defined types were placed in this encounter.   Patient Instructions  Taper off sertraline - take 1/2 tablet of 50mg  (25mg  dose) daily for the next 2-3 weeks then off.  If noticeably worsening stress, let me know.  If needed next step would be counselor.   Follow up plan: Return if symptoms worsen or fail to improve.  Eustaquio Boyden, MD

## 2023-03-22 ENCOUNTER — Encounter: Payer: Self-pay | Admitting: Urology

## 2023-03-22 ENCOUNTER — Ambulatory Visit: Payer: BC Managed Care – PPO | Admitting: Urology

## 2023-03-22 VITALS — BP 124/81 | HR 82 | Ht 62.0 in | Wt 150.0 lb

## 2023-03-22 DIAGNOSIS — N3946 Mixed incontinence: Secondary | ICD-10-CM | POA: Diagnosis not present

## 2023-03-22 LAB — URINALYSIS, COMPLETE
Bilirubin, UA: NEGATIVE
Glucose, UA: NEGATIVE
Ketones, UA: NEGATIVE
Leukocytes,UA: NEGATIVE
Nitrite, UA: NEGATIVE
Protein,UA: NEGATIVE
RBC, UA: NEGATIVE
Specific Gravity, UA: 1.015 (ref 1.005–1.030)
Urobilinogen, Ur: 0.2 mg/dL (ref 0.2–1.0)
pH, UA: 5.5 (ref 5.0–7.5)

## 2023-03-22 LAB — MICROSCOPIC EXAMINATION: RBC, Urine: NONE SEEN /[HPF] (ref 0–2)

## 2023-03-22 LAB — BLADDER SCAN AMB NON-IMAGING

## 2023-03-22 NOTE — Progress Notes (Signed)
03/22/2023 9:17 AM   Alvan Dame Claiborne December 09, 1975 [id number]  Referring provider: Eustaquio Boyden, MD [address] [place],  [place] [address]  No chief complaint on file.   HPI: Patient urgency continence is much better on oxybutynin over the last 2 years.  She leaks with coughing sneezing exercise and working in the yard.  She wears 2-3 pads a day that are damp but can be more wet if she is active   She voids every 2-3 hours gets up twice a night.  Reports good flow and feels empty   Has not had a hysterectomy.  On pelvic examination she had grade 2 hypermobility the bladder neck and negative cough test with a good cough. She had some rotational descent of a small grade 1 cystocele but no significant central defect. No hinging effect.    Patient has mild mixed incontinence.  Stress component affecting quality of life and limiting activity.  Role of urodynamics discussed.  Requirement for possible referral in the future discussed.  I thought it was prudent to have patient come back in for the results before referral and she agreed.   Today Frequency stable.  Incontinence stable. On urodynamics patient did not void and was catheterized for 125 mL.  Maximum bladder capacity was 295 mL.  Bladder was stable.  She had increased bladder sensation.  200 mL her cough leak point pressure was 35 cm of water with severe leak each.  Her Valsalva leak point pressure at the same volume was 43 cmH2O with moderate leakage.  At 300 mL her cough leak point pressure was 60 cm of water with severe leak each.  Her Valsalva leak point pressure was 66 cm of water with moderate to severe leakage.  During voiding she voided 208 mL with a maximal flow 4 mL/s.  Max voiding pressure was 7 cm of water.  She had a prolonged somewhat intermittent voiding pattern.  Residual was 187 mL.  She had increased EMG activity during voiding.  Bladder neck descent at less than a centimeter.  There was a misprint in the  urodynamics summary that said she had no stress incontinence.  The details of the urodynamics are signed and dictated  Postvoid residual today was 11 mL   PMH: Past Medical History:  Diagnosis Date   Acute cystitis without hematuria 02/03/2016   Acute sinusitis 11/03/2017   Hepatomegaly 09/16/2017   Mixed stress and urge incontinence 06/27/2008   Pedal edema 10/30/2020   Skin lumps 10/30/2020    Surgical History: Past Surgical History:  Procedure Laterality Date   BIOPSY  02/24/2023   Procedure: BIOPSY;  Surgeon: Wyline Mood, MD;  Location: [facility] ENDOSCOPY;  Service: Gastroenterology;;   COLONOSCOPY WITH PROPOFOL N/A 02/24/2023   int hem, biopsy: lymphocytic colitis Wyline Mood, MD)   TUBAL LIGATION  2002    Home Medications:  Allergies as of 03/22/2023   No Known Allergies      Medication List        Accurate as of March 22, 2023  9:17 AM. If you have any questions, ask your nurse or doctor.          budesonide 3 MG 24 hr capsule Commonly known as: ENTOCORT EC Take Budesonide 3 mg (3) capsules daily for 1 month, then (2) capsules daily for 1 month, then (1) capsule daily for 1 month.   CRANBERRY PO Take by mouth.   oxybutynin 10 MG 24 hr tablet Commonly known as: DITROPAN-XL Take 1 tablet (  10 mg total) by mouth daily.   sertraline 50 MG tablet Commonly known as: ZOLOFT Take 0.5 tablets (25 mg total) by mouth daily.        Allergies: No Known Allergies  Family History: Family History  Problem Relation Age of Onset   Hypertension Father    CAD Father 70       MI   Cancer Father 96       lung (remote smoker)   CAD Brother 66       MI   Cancer Maternal Grandfather        ?colon vs pancreas   Cancer Paternal Uncle        ?stomach vs pancreas   Prostate cancer Neg Hx    Bladder Cancer Neg Hx    Kidney cancer Neg Hx     Social History:  reports that she has never smoked. She has been exposed to tobacco smoke. She has never used smokeless  tobacco. She reports that she does not drink alcohol and does not use drugs.  ROS:                                        Physical Exam: LMP 03/03/2023   Constitutional:  Alert and oriented, No acute distress. HEENT: [place] AT, moist mucus membranes.  Trachea midline, no masses.   Laboratory Data: No results found for: "WBC", "HGB", "HCT", "MCV", "PLT"  Lab Results  Component Value Date   CREATININE 0.75 03/25/2022    No results found for: "PSA"  No results found for: "TESTOSTERONE"  Lab Results  Component Value Date   HGBA1C 5.1 04/01/2022    Urinalysis    Component Value Date/Time   COLORURINE lt. yellow 04/01/2010 1613   APPEARANCEUR Hazy (A) 01/18/2023 0926   LABSPEC 1.015 04/01/2010 1613   PHURINE 8.0 04/01/2010 1613   GLUCOSEU Negative 01/18/2023 0926   HGBUR large 04/01/2010 1613   BILIRUBINUR Negative 01/18/2023 0926   PROTEINUR Trace (A) 01/18/2023 0926   UROBILINOGEN 0.2 08/19/2021 1308   UROBILINOGEN 0.2 04/01/2010 1613   NITRITE Positive (A) 01/18/2023 0926   NITRITE negative 04/01/2010 1613   LEUKOCYTESUR 1+ (A) 01/18/2023 0926    Pertinent Imaging:   Assessment & Plan: Patient has mixed incontinence.  The stress component is affecting her quality life.  I reviewed a sling and bulking agent in full detail.  Template utilized.  Picture drawn.  Persistent or worsening mixed incontinence and overactive bladder discussed.  For waiting and physical therapy discussed but her work schedule is too busy  She is looking for the most definitive treatment and would like to proceed with a sling.  I renewed her oxybutynin.  She would like to see Dr. Arita Miss.  She understands Dr. Arita Miss likely will reexamine her.  Dr. Arita Miss may perform cystoscopy prior to surgery.  I will scanned this note into the medical record  There are no diagnoses linked to this encounter.  No follow-ups on file.  Martina Sinner, MD  [facility] [address], [address] [place], [place] [address] [phone]

## 2023-04-13 ENCOUNTER — Ambulatory Visit: Payer: BC Managed Care – PPO | Admitting: Physician Assistant

## 2023-04-13 ENCOUNTER — Encounter: Payer: Self-pay | Admitting: Physician Assistant

## 2023-04-13 VITALS — BP 124/78 | HR 86 | Temp 97.7°F | Ht 62.0 in | Wt 151.8 lb

## 2023-04-13 DIAGNOSIS — K52832 Lymphocytic colitis: Secondary | ICD-10-CM | POA: Diagnosis not present

## 2023-04-13 NOTE — Progress Notes (Signed)
Jennifer Shea [address]  [address]  [place], [place] [address]  Main: [phone]  Fax: [phone]   Primary Care Physician: Jennifer Boyden, MD  Primary Gastroenterologist:  Jennifer Shea / Dr. Wyline Shea    CC: F/U Lymphocytic Colitis  HPI: Jennifer Shea is a 47 y.o. female returns for follow-up of diarrhea and Hemoccult positive stool.  Colonoscopy 02/2023 showed medium nonbleeding internal hemorrhoids, otherwise normal with no polyps.  Excellent prep.  Biopsies positive for lymphocytic colitis.  10-year repeat.  Since her procedure she stopped taking Zoloft (sertraline).  She continued having diarrhea.  She was started on budesonide 9 Mg daily for 1 month, then 6 Mg daily for 1 month, then 3 Mg daily for 1 month, then stop.  On this treatment she feels 100% better.  Currently she is having 1 or 2 normal formed bowel movements daily with no more diarrhea.  She admits to taking NSAIDs and Excedrin in the past but not recently.  No GI concerns today.  Patient education discussed regarding lymphocytic colitis.  Current Outpatient Medications  Medication Sig Dispense Refill   budesonide (ENTOCORT EC) 3 MG 24 hr capsule Take Budesonide 3 mg (3) capsules daily for 1 month, then (2) capsules daily for 1 month, then (1) capsule daily for 1 month. 180 capsule 0   CRANBERRY PO Take by mouth.     oxybutynin (DITROPAN-XL) 10 MG 24 hr tablet Take 1 tablet (10 mg total) by mouth daily. 30 tablet 11   No current facility-administered medications for this visit.    Allergies as of 04/13/2023   (No Known Allergies)    Past Medical History:  Diagnosis Date   Acute cystitis without hematuria 02/03/2016   Acute sinusitis 11/03/2017   Hepatomegaly 09/16/2017   Mixed stress and urge incontinence 06/27/2008   Pedal edema 10/30/2020   Skin lumps 10/30/2020    Past Surgical History:  Procedure Laterality Date   BIOPSY  02/24/2023   Procedure: BIOPSY;  Surgeon: Jennifer Mood,  MD;  Location: [facility] ENDOSCOPY;  Service: Gastroenterology;;   COLONOSCOPY WITH PROPOFOL N/A 02/24/2023   int hem, biopsy: lymphocytic colitis Jennifer Shea, Jennifer Salina, MD)   TUBAL LIGATION  2002    Review of Systems:    All systems reviewed and negative except where noted in HPI.   Physical Examination:   BP 124/78   Pulse 86   Temp 97.7 F (36.5 C)   Ht 5\' 2"  (1.575 m)   Wt 151 lb 12.8 oz (68.9 kg)   LMP 03/03/2023   BMI 27.76 kg/m   General: Well-nourished, well-developed in no acute distress.  Neuro: Alert and oriented x 3.  Grossly intact.  Psych: Alert and cooperative, normal Shea and affect.   Imaging Studies: No results found.  Assessment and Plan:   Jennifer Shea is a 47 y.o. y/o female returns for follow-up of chronic diarrhea for 1 year.  Colonoscopy 02/2023 showed lymphocytic colitis.  Diarrhea has resolved off sertraline and after starting budesonide.  She is feeling a lot better with no more diarrhea.  1.  Lymphocytic colitis -in clinical remission.  Continue budesonide taper: 6 Mg daily for 1 month, then 3 Mg daily for 1 month, then discontinue.  Avoid all medications which can cause lymphocytic colitis including Zoloft, sertraline, aspirin, NSAIDs, PPIs.  Printed list was given from up-to-date.  I gave patient education handout from up-to-date.  Discussed etiology and treatment of lymphocytic colitis.  Follow-up in the future if  she has another flareup.    Jennifer Shea  Follow up as needed if she has recurrent diarrhea.

## 2023-05-17 ENCOUNTER — Other Ambulatory Visit: Payer: Self-pay | Admitting: Urology

## 2023-05-30 ENCOUNTER — Other Ambulatory Visit: Payer: Self-pay | Admitting: Physician Assistant

## 2023-05-30 IMAGING — US US EXTREM UP *R* LTD
1 series · 8 of 8 positions shown · non-contrast
Comparison: None

CLINICAL DATA: tender skin lump to R posterior upper arm

EXAM:
ULTRASOUND RIGHT UPPER EXTREMITY LIMITED
TECHNIQUE: Ultrasound examination of the upper extremity soft tissues was
performed in the area of clinical concern.

[Series 1: us extrem up *right* ltd · 0.07mm/px · 8 acquisitions, 8 frames shown]
[im 1/8]
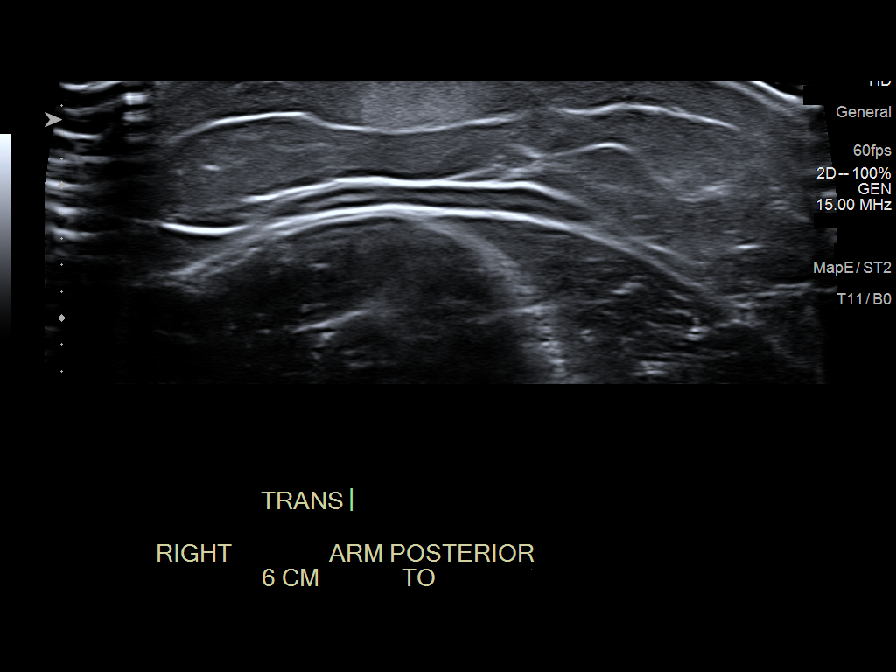
[im 2/8]
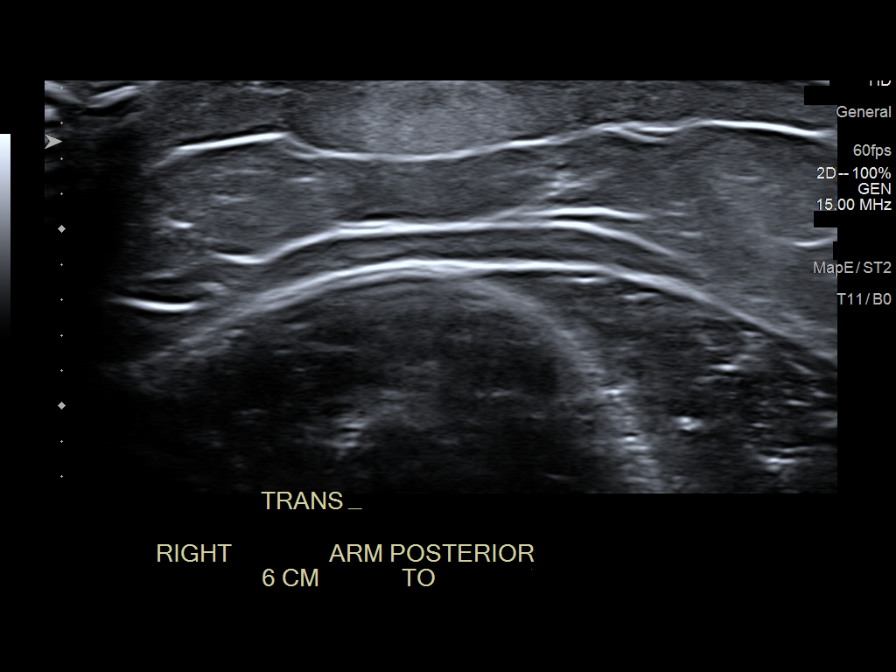
[im 3/8]
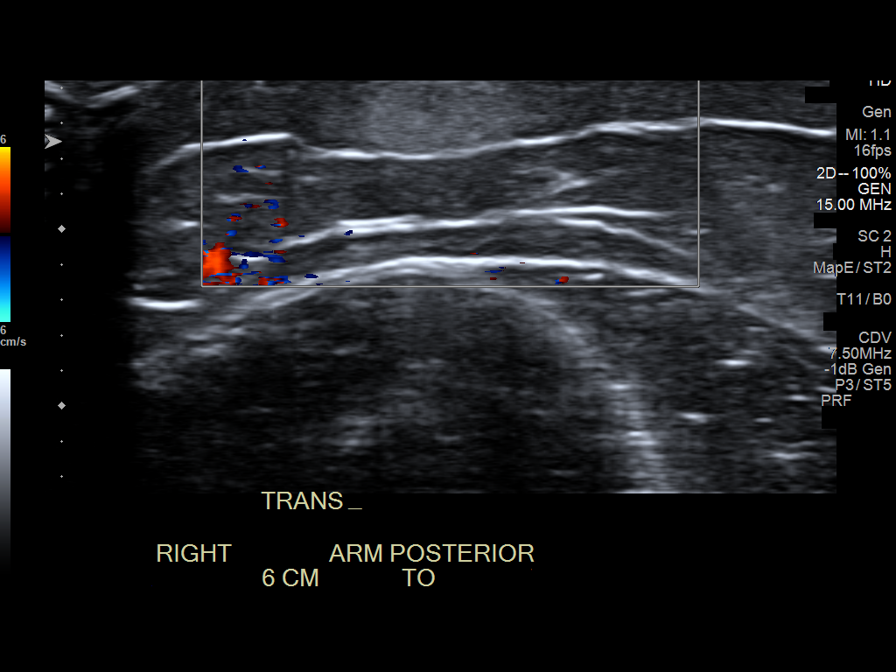
[im 4/8]
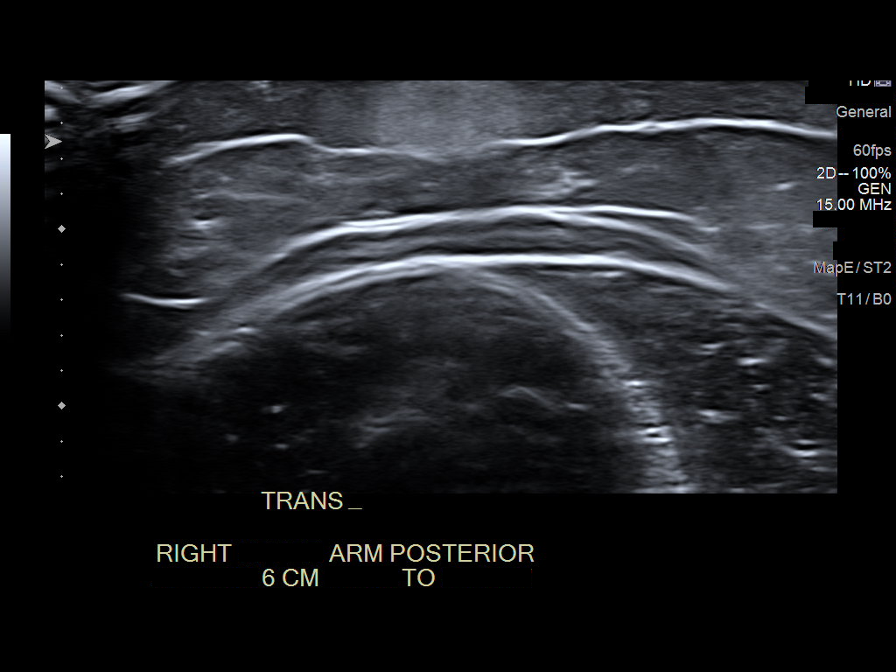
[im 5/8]
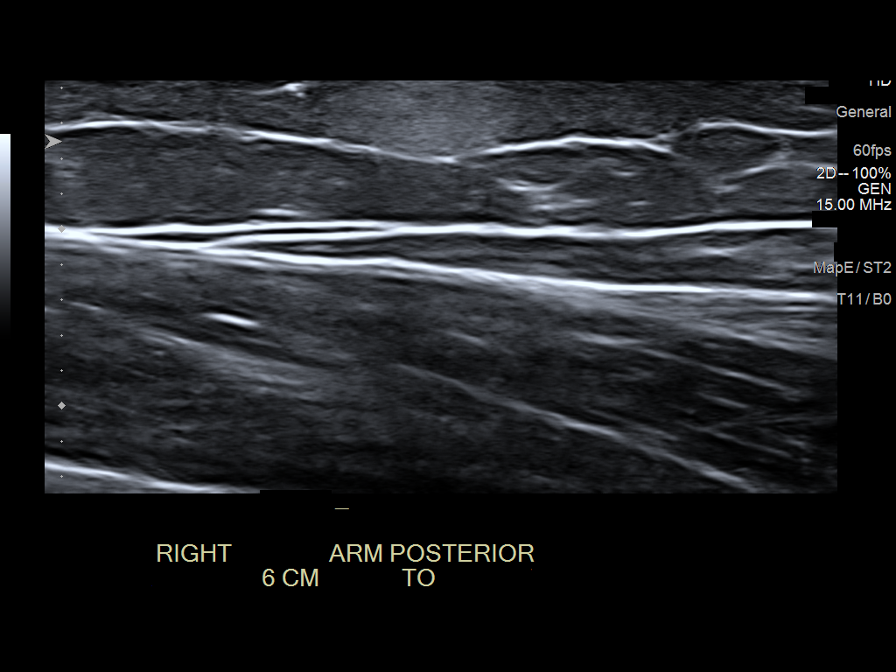
[im 6/8]
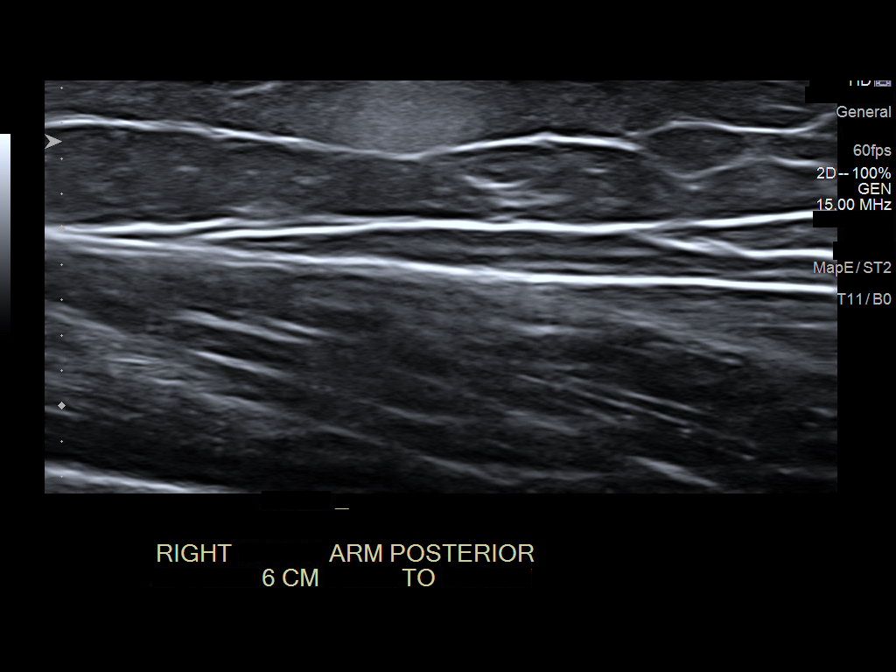
[im 7/8]
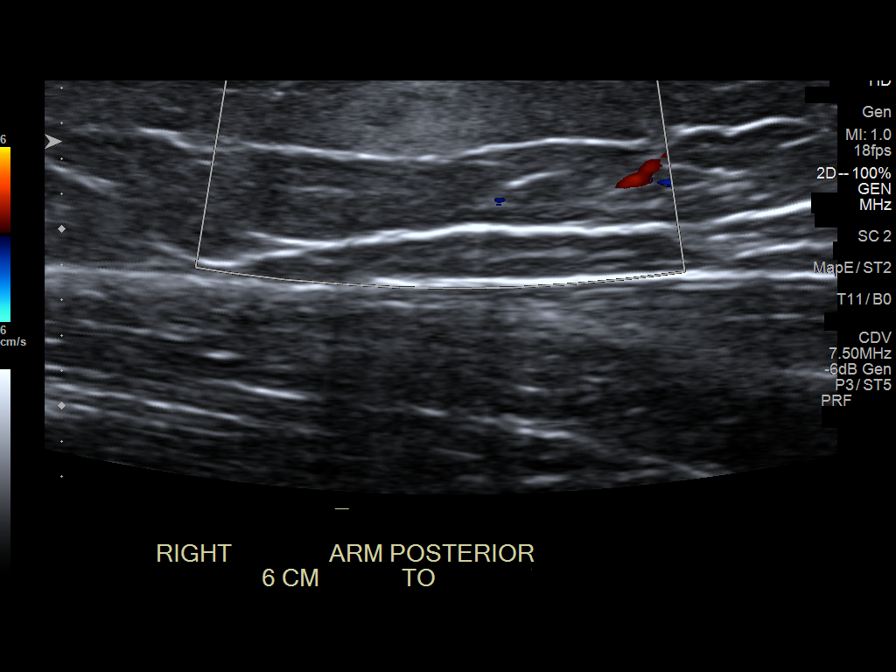
[im 8/8]
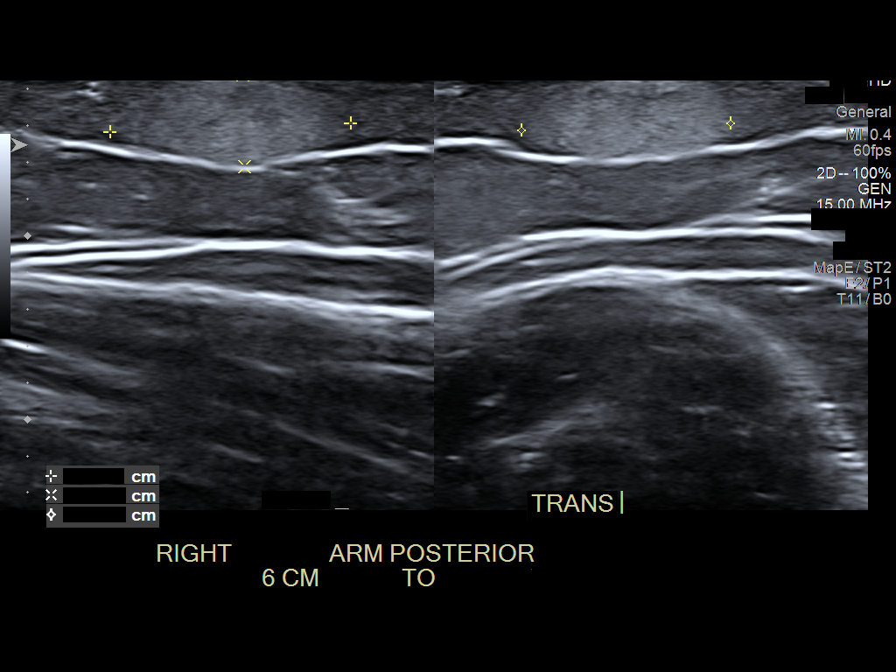

[8 of 8 positions shown; findings below may reference images not displayed]

FINDINGS: In the palpable area of concern, there is a hyperechoic area without
any internal vascularity measuring 1.3 x 1.1 x 0.5 cm. There is no
posterior shadowing or acoustic enhancement. There is no evidence of
associated mineralization. There is no evidence of sinus tract to
the skin surface. This is confined to the subcutaneous tissue just
beneath the skin.
IMPRESSION: 1.3 cm hyperechoic area within the subcutaneous tissues of the right
upper extremity, nonspecific but could represent contusion, fat
necrosis, or unencapsulated hyperechoic lipoma. Recommend clinical
follow-up and ultrasound in 6-12 weeks.

## 2023-06-01 ENCOUNTER — Encounter (HOSPITAL_BASED_OUTPATIENT_CLINIC_OR_DEPARTMENT_OTHER): Payer: Self-pay | Admitting: Urology

## 2023-06-01 NOTE — Progress Notes (Signed)
Spoke w/ via phone for pre-op interview---[facility] needs dos----UPT per anesthesia         Lab results------ COVID test -----patient states asymptomatic no test needed Arrive at -------0745 NPO after MN NO Solid Food.   Med rec completed Medications to take morning of surgery -----Oxybutinin, Budesonide Diabetic medication ----- Patient instructed no nail polish to be worn day of surgery Patient instructed to bring photo id and insurance card day of surgery Patient aware to have Driver (ride ) / caregiver    for 24 hours after surgery - Husband Clifton Custard Strickler Patient Special Instructions ----- Pre-Op special Instructions ----- Patient verbalized understanding of instructions that were given at this phone interview. Patient denies chest pain, sob, fever, cough at the interview.

## 2023-06-04 NOTE — H&P (Signed)
CC/HPI: cc: SUI   04/30/23: 47 year old woman with a history of stress urinary continence here to discuss management options. She previously saw Dr. Sherron Monday in [place] and underwent a UDS. She is currently on oxybutynin for urgency. She occasionally does get UTIs. She has had stress urinary continence for 20 years since the birth of her last child. Pelvic floor physical therapy was cost prohibitive when looked into in July 2023. She leaks with coughing and sneezing as well as exercise and working in the yard. She wears 2-3 pads a day that are damp but more if she is active.   LEAK POINT PRESSURE  LPPs were assessed with pt in a seated position. Both x-ray and visualization were used to access SUI. She leaked with both coughing and Valsalva.   CLPP at approx. 200 mls was 35 cmH20 with severe leakage and VLPP was 43 cmH20 with moderate leakage.  CLPP at approx. 300 mls inserted was 60 cmH20 with severe leakage and VLPP was 66 cmH20 with moderate to severe leakage.   Overactive bladder body question screener: 3, 3, 4, 4, 3, 3, 3, 2=25/40     ALLERGIES: No Known Allergies    MEDICATIONS: Cranberry  Oxybutynin Chloride Er 10 mg tablet, extended release 24 hr  Prednisone     GU PSH: Complex cystometrogram, w/ void pressure and urethral pressure profile studies, any technique - 02/04/2023 Complex Uroflow - 02/04/2023 Emg surf Electrd - 02/04/2023 Inject For cystogram - 02/04/2023 Intrabd voidng Press - 02/04/2023     NON-GU PSH: Bilateral Tubal Ligation     GU PMH: Mixed incontinence - 02/04/2023    NON-GU PMH: No Non-GU PMH    FAMILY HISTORY: Blood In Urine - Father Heart Attack - Father Kidney Stones - Father Lung Cancer - Father   SOCIAL HISTORY: Marital Status: Married Preferred Language: English; Ethnicity: Not Hispanic Or Latino; Race: White Current Smoking Status: Patient has never smoked.   Tobacco Use Assessment Completed: Used Tobacco in last 30 days? Does not drink  anymore.  Drinks 2 caffeinated drinks per day.    REVIEW OF SYSTEMS:    GU Review Female:   Patient reports frequent urination, get up at night to urinate, and leakage of urine. Patient denies hard to postpone urination, burning /pain with urination, stream starts and stops, trouble starting your stream, have to strain to urinate, and being pregnant.  Gastrointestinal (Upper):   Patient denies nausea, indigestion/ heartburn, and vomiting.  Gastrointestinal (Lower):   Patient denies diarrhea and constipation.  Constitutional:   Patient denies fever, night sweats, weight loss, and fatigue.  Skin:   Patient denies skin rash/ lesion and itching.  Eyes:   Patient denies blurred vision and double vision.  Ears/ Nose/ Throat:   Patient denies sore throat and sinus problems.  Hematologic/Lymphatic:   Patient denies swollen glands and easy bruising.  Cardiovascular:   Patient denies leg swelling and chest pains.  Respiratory:   Patient denies cough and shortness of breath.  Endocrine:   Patient denies excessive thirst.  Musculoskeletal:   Patient denies back pain and joint pain.  Neurological:   Patient denies headaches and dizziness.  Psychologic:   Patient denies depression and anxiety.   VITAL SIGNS:      04/30/2023 03:26 PM  Weight 150 lb / 68.04 kg  Height 62 in / 157.48 cm  BP 118/75 mmHg  Pulse 77 /min  Temperature 97.7 F / 36.5 C  BMI 27.4 kg/m   MULTI-SYSTEM PHYSICAL EXAMINATION:  Constitutional: Well-nourished. No physical deformities. Normally developed. Good grooming.  Neck: Neck symmetrical, not swollen. Normal tracheal position.  Respiratory: No labored breathing, no use of accessory muscles.   Skin: No paleness, no jaundice, no cyanosis. No lesion, no ulcer, no rash.  Neurologic / Psychiatric: Oriented to time, oriented to place, oriented to person. No depression, no anxiety, no agitation.  Eyes: Normal conjunctivae. Normal eyelids.  Ears, Nose, Mouth, and Throat: Left  ear no scars, no lesions, no masses. Right ear no scars, no lesions, no masses. Nose no scars, no lesions, no masses. Normal hearing. Normal lips.  Musculoskeletal: Normal gait and station of head and neck.     Complexity of Data:  Records Review:   Previous Hospital Records, Previous Patient Records, POC Tool  Urine Test Review:   Urinalysis   PROCEDURES:         PVR Ultrasound - 16109  Scanned Volume: 24 cc         Urinalysis w/Scope Dipstick Dipstick Cont'd Micro  Color: Yellow Bilirubin: Neg mg/dL WBC/hpf: 20 - 60/AVW  Appearance: Slightly Cloudy Ketones: Neg mg/dL RBC/hpf: 0 - 2/hpf  Specific Gravity: 1.015 Blood: Neg ery/uL Bacteria: Many (>50/hpf)  pH: 7.5 Protein: Trace mg/dL Cystals: Amorph Phosphates  Glucose: Neg mg/dL Urobilinogen: 0.2 mg/dL Casts: NS (Not Seen)    Nitrites: Positive Trichomonas: Not Present    Leukocyte Esterase: 2+ leu/uL Mucous: Present      Epithelial Cells: 6 - 10/hpf      Yeast: NS (Not Seen)      Sperm: Not Present         Notes:   p   ASSESSMENT:      ICD-10 Details  1 GU:   Stress Incontinence - N39.3 Chronic, Stable  2   Urinary Urgency - R39.15 Chronic, Stable  3   Acute Cystitis/UTI - N30.00 Acute, Stable   PLAN:            Medications New Meds: Macrobid 100 mg capsule 1 capsule PO BID   #10  0 Refill(s)  Fluconazole 150 mg tablet 1 tablet PO Daily for yeast infection  #1  0 Refill(s)  Pharmacy Name:  [id number]  Address:  [address]   [place], [place] [address]  Phone:  [phone]  Fax:  [phone]            Orders Labs CULTURE, URINE          Document Letter(s):  Created for Patient: Clinical Summary         Notes:   1. Stress urinary incontinence: I reviewed patient's urodynamic study as well as prior notes from Dr. Sherron Monday. They had previously discussed options including pelvic floor physical therapy, Bulkamid and mid urethral sling. Patient is interested in moving forward with mid urethral  sling. I gave her informational handout and discussed how I perform a retropubic Lynx sling. Risks and benefits of the procedure including but not limited to pain, bleeding, infection, urinary retention, need for sling incision or mesh removal, mesh extrusion/erosion, need for additional treatment, damage strength structures. We also discussed the possibility of worsening urinary urgency. Patient understands his risk and wishes to proceed.   2. Urinary urgency: She is currently on oxybutynin which is working well but we discussed long-term risks of anticholinergics. We may try and see if a beta 3 agonist is covered postoperatively.   3. Acute cystitis: Will treat empirically with Macrobid and send urine for culture. Would like to repeat a urinalysis  prior to surgery.

## 2023-06-08 ENCOUNTER — Ambulatory Visit (HOSPITAL_BASED_OUTPATIENT_CLINIC_OR_DEPARTMENT_OTHER): Payer: BC Managed Care – PPO | Admitting: Anesthesiology

## 2023-06-08 ENCOUNTER — Other Ambulatory Visit: Payer: Self-pay

## 2023-06-08 ENCOUNTER — Encounter (HOSPITAL_BASED_OUTPATIENT_CLINIC_OR_DEPARTMENT_OTHER)
Admission: RE | Disposition: A | Discharge status: Home / Self Care | Payer: Self-pay | Source: Home / Self Care | Attending: Urology

## 2023-06-08 ENCOUNTER — Encounter (HOSPITAL_BASED_OUTPATIENT_CLINIC_OR_DEPARTMENT_OTHER): Payer: Self-pay | Admitting: Urology

## 2023-06-08 ENCOUNTER — Ambulatory Visit (HOSPITAL_BASED_OUTPATIENT_CLINIC_OR_DEPARTMENT_OTHER)
Admission: RE | Admit: 2023-06-08 | Discharge: 2023-06-08 | Disposition: A | Discharge status: Home / Self Care | Payer: BC Managed Care – PPO | Attending: Urology | Admitting: Urology

## 2023-06-08 DIAGNOSIS — Z01818 Encounter for other preprocedural examination: Secondary | ICD-10-CM

## 2023-06-08 DIAGNOSIS — N393 Stress incontinence (female) (male): Secondary | ICD-10-CM | POA: Diagnosis present

## 2023-06-08 DIAGNOSIS — Z87440 Personal history of urinary (tract) infections: Secondary | ICD-10-CM | POA: Insufficient documentation

## 2023-06-08 DIAGNOSIS — N8110 Cystocele, unspecified: Secondary | ICD-10-CM | POA: Insufficient documentation

## 2023-06-08 DIAGNOSIS — R3915 Urgency of urination: Secondary | ICD-10-CM | POA: Insufficient documentation

## 2023-06-08 DIAGNOSIS — N8111 Cystocele, midline: Secondary | ICD-10-CM | POA: Diagnosis not present

## 2023-06-08 HISTORY — PX: PUBOVAGINAL SLING: SHX1035

## 2023-06-08 HISTORY — PX: CYSTOSCOPY: SHX5120

## 2023-06-08 LAB — POCT PREGNANCY, URINE: Preg Test, Ur: NEGATIVE

## 2023-06-08 SURGERY — CYSTOSCOPY
Anesthesia: General | Site: Vagina

## 2023-06-08 MED ORDER — MIDAZOLAM HCL 2 MG/2ML IJ SOLN
INTRAMUSCULAR | Status: AC
  Filled 2023-06-08: qty 2

## 2023-06-08 MED ORDER — MIDAZOLAM HCL 5 MG/5ML IJ SOLN
INTRAMUSCULAR | Status: DC | PRN
  Administered 2023-06-08: 2 mg via INTRAVENOUS

## 2023-06-08 MED ORDER — ONDANSETRON HCL 4 MG/2ML IJ SOLN
INTRAMUSCULAR | Status: DC | PRN
  Administered 2023-06-08: 4 mg via INTRAVENOUS

## 2023-06-08 MED ORDER — ONDANSETRON HCL 4 MG/2ML IJ SOLN
INTRAMUSCULAR | Status: AC
  Filled 2023-06-08: qty 2

## 2023-06-08 MED ORDER — LIDOCAINE-EPINEPHRINE (PF) 1 %-1:200000 IJ SOLN
INTRAMUSCULAR | Status: DC | PRN
  Administered 2023-06-08: 2 mL

## 2023-06-08 MED ORDER — STERILE WATER FOR IRRIGATION IR SOLN
Status: DC | PRN
  Administered 2023-06-08: 500 mL

## 2023-06-08 MED ORDER — KETOROLAC TROMETHAMINE 30 MG/ML IJ SOLN
INTRAMUSCULAR | Status: AC
  Filled 2023-06-08: qty 1

## 2023-06-08 MED ORDER — OXYCODONE HCL 5 MG/5ML PO SOLN
5.0000 mg | Freq: Once | ORAL | Status: DC | PRN
Start: 2023-06-08 — End: 2023-06-08

## 2023-06-08 MED ORDER — OXYCODONE HCL 5 MG PO TABS: 5.0000 mg | ORAL_TABLET | Freq: Four times a day (QID) | ORAL | 0 refills | Status: DC | PRN

## 2023-06-08 MED ORDER — PROPOFOL 10 MG/ML IV BOLUS
INTRAVENOUS | Status: AC
  Filled 2023-06-08: qty 20

## 2023-06-08 MED ORDER — SODIUM CHLORIDE 0.9 % IV SOLN: INTRAVENOUS | Status: DC

## 2023-06-08 MED ORDER — FENTANYL CITRATE (PF) 100 MCG/2ML IJ SOLN
INTRAMUSCULAR | Status: DC | PRN
  Administered 2023-06-08: 50 ug via INTRAVENOUS

## 2023-06-08 MED ORDER — PROPOFOL 10 MG/ML IV BOLUS
INTRAVENOUS | Status: DC | PRN
  Administered 2023-06-08: 10 mg via INTRAVENOUS

## 2023-06-08 MED ORDER — ONDANSETRON HCL 4 MG/2ML IJ SOLN: 4.0000 mg | Freq: Four times a day (QID) | INTRAMUSCULAR | Status: DC | PRN

## 2023-06-08 MED ORDER — OXYCODONE HCL 5 MG PO TABS: 5.0000 mg | ORAL_TABLET | Freq: Once | ORAL | Status: DC | PRN

## 2023-06-08 MED ORDER — 0.9 % SODIUM CHLORIDE (POUR BTL) OPTIME: TOPICAL | Status: DC | PRN

## 2023-06-08 MED ORDER — CIPROFLOXACIN HCL 250 MG PO TABS: 250.0000 mg | ORAL_TABLET | Freq: Every day | ORAL | 0 refills | Status: AC

## 2023-06-08 MED ORDER — DEXAMETHASONE SODIUM PHOSPHATE 10 MG/ML IJ SOLN
INTRAMUSCULAR | Status: AC
  Filled 2023-06-08: qty 1

## 2023-06-08 MED ORDER — LIDOCAINE 2% (20 MG/ML) 5 ML SYRINGE
INTRAMUSCULAR | Status: DC | PRN
  Administered 2023-06-08: 60 mg via INTRAVENOUS

## 2023-06-08 MED ORDER — FENTANYL CITRATE (PF) 100 MCG/2ML IJ SOLN
INTRAMUSCULAR | Status: AC
  Filled 2023-06-08: qty 2

## 2023-06-08 MED ORDER — PHENYLEPHRINE 80 MCG/ML (10ML) SYRINGE FOR IV PUSH (FOR BLOOD PRESSURE SUPPORT)
PREFILLED_SYRINGE | INTRAVENOUS | Status: DC | PRN
  Administered 2023-06-08: 160 ug via INTRAVENOUS
  Administered 2023-06-08: 120 ug via INTRAVENOUS
  Administered 2023-06-08 (×2): 160 ug via INTRAVENOUS

## 2023-06-08 MED ORDER — CEFAZOLIN SODIUM-DEXTROSE 2-4 GM/100ML-% IV SOLN
INTRAVENOUS | Status: AC
  Filled 2023-06-08: qty 100

## 2023-06-08 MED ORDER — DEXAMETHASONE SODIUM PHOSPHATE 10 MG/ML IJ SOLN
INTRAMUSCULAR | Status: DC | PRN
  Administered 2023-06-08: 5 mg via INTRAVENOUS

## 2023-06-08 MED ORDER — PHENYLEPHRINE 80 MCG/ML (10ML) SYRINGE FOR IV PUSH (FOR BLOOD PRESSURE SUPPORT)
PREFILLED_SYRINGE | INTRAVENOUS | Status: AC
  Filled 2023-06-08: qty 20

## 2023-06-08 MED ORDER — FENTANYL CITRATE (PF) 100 MCG/2ML IJ SOLN: 25.0000 ug | INTRAMUSCULAR | Status: DC | PRN

## 2023-06-08 MED ORDER — KETOROLAC TROMETHAMINE 30 MG/ML IJ SOLN
INTRAMUSCULAR | Status: DC | PRN
  Administered 2023-06-08: 30 mg via INTRAVENOUS

## 2023-06-08 MED ORDER — CEFAZOLIN SODIUM-DEXTROSE 2-4 GM/100ML-% IV SOLN
2.0000 g | INTRAVENOUS | Status: AC
  Administered 2023-06-08: 2 g via INTRAVENOUS

## 2023-06-08 MED ORDER — SODIUM CHLORIDE 0.9 % IR SOLN
Status: DC | PRN
  Administered 2023-06-08: 3000 mL

## 2023-06-08 MED ORDER — ESTRADIOL 0.1 MG/GM VA CREA
TOPICAL_CREAM | VAGINAL | Status: DC | PRN
  Administered 2023-06-08: 1 via VAGINAL

## 2023-06-08 SURGICAL SUPPLY — 33 items
BAG URINE DRAIN 2000ML AR STRL (UROLOGICAL SUPPLIES) ×2 IMPLANT
BLADE CLIPPER SENSICLIP SURGIC (BLADE) IMPLANT
BLADE SURG 15 STRL LF DISP TIS (BLADE) ×2 IMPLANT
CATH FOLEY 2WAY SLVR 5CC 14FR (CATHETERS) ×2 IMPLANT
DERMABOND ADVANCED .7 DNX12 (GAUZE/BANDAGES/DRESSINGS) ×2 IMPLANT
ELECT REM PT RETURN 9FT ADLT (ELECTROSURGICAL) ×2 IMPLANT
ELECTRODE REM PT RTRN 9FT ADLT (ELECTROSURGICAL) IMPLANT
GAUZE STRIP PACKING 2INX5YD (MISCELLANEOUS) ×2 IMPLANT
GLOVE BIO SURGEON STRL SZ 6.5 (GLOVE) ×2 IMPLANT
GOWN STRL REUS W/TWL LRG LVL3 (GOWN DISPOSABLE) ×2 IMPLANT
HOLDER FOLEY CATH W/STRAP (MISCELLANEOUS) IMPLANT
IV NS IRRIG 3000ML ARTHROMATIC (IV SOLUTION) IMPLANT
KIT TURNOVER CYSTO (KITS) ×2 IMPLANT
MANIFOLD NEPTUNE II (INSTRUMENTS) IMPLANT
MARKER SKIN DUAL TIP RULER LAB (MISCELLANEOUS) IMPLANT
NDL HYPO 25X1 1.5 SAFETY (NEEDLE) ×2 IMPLANT
NEEDLE HYPO 25X1 1.5 SAFETY (NEEDLE) ×2 IMPLANT
NS IRRIG 500ML POUR BTL (IV SOLUTION) IMPLANT
PACK VAGINAL WOMENS (CUSTOM PROCEDURE TRAY) ×2 IMPLANT
PLUG CATH AND CAP STRL 200 (CATHETERS) IMPLANT
RETRACTOR LONE STAR DISPOSABLE (INSTRUMENTS) IMPLANT
RETRACTOR STAY HOOK 5MM (MISCELLANEOUS) ×2 IMPLANT
SET IRRIG Y TYPE TUR BLADDER L (SET/KITS/TRAYS/PACK) ×2 IMPLANT
SHEET LAVH (DRAPES) ×2 IMPLANT
SLEEVE SCD COMPRESS KNEE MED (STOCKING) ×2 IMPLANT
SLING LYNX SUPRAPUBIC (Sling) IMPLANT
SUT VIC AB 2-0 SH 27XBRD (SUTURE) ×2 IMPLANT
SUT VIC AB 3-0 SH 27X BRD (SUTURE) IMPLANT
SUT VIC AB 4-0 PS2 18 (SUTURE) IMPLANT
SYR 10ML LL (SYRINGE) IMPLANT
SYR BULB IRRIG 60ML STRL (SYRINGE) IMPLANT
TUBE CONNECTING 12X1/4 (SUCTIONS) IMPLANT
WATER STERILE IRR 500ML POUR (IV SOLUTION) IMPLANT

## 2023-06-08 NOTE — Discharge Instructions (Addendum)
Post-op Instructions following Mid-urethral Sling surgery   Diet:  You may return to your normal diet within 24 hours following your surgery. You may note some mild nausea and possibly vomiting the first 6-8 hours following surgery. This is usually due to the side effects of anesthesia, and will disappear quite soon. I would suggest clear liquids and a very light meal the first evening following your surgery.  Activity:  Your physical activity is to be restricted, especially during the first 2 weeks home. During this time use the following guidelines:  No lifting heavy objects (anything greater than 10 pounds). No driving a car and limit long car rides. No strenuous exercise, limits stairclimbing to a minimum. Do not swim or soak in a bath tub for 2 weeks. Do not place anything per vagina for 4 weeks.  Wound care: There is very little wound care.  The suprapubic incisions (above your pubic bone) are glued closed and this will peel off over the next 7-14 days.  They do not need to be covered.   The vaginal incision may ooze/bleed a little bit the first couple days after surgery.  This is normal.  It is okay to use a sanitary pad to help keep things clean.  Otherwise this incision needs no additional care.  Bowels:  You may need a stool softener and. A bowel movement every other day is reasonable. Use a mild laxative if needed, such as milk of magnesia 2-3 tablespoons, or 2 Dulcolax tablets. Call if you continue to have problems. If you had been taking narcotics for pain, before, during or after your surgery, you may be constipated. Take a laxative if necessary.  Medication:  You should resume your pre-surgery medications unless told not to. In addition you may be given an antibiotic to prevent or treat infection. Antibiotics are not always necessary. Pain pills (Tramadol) may also be given to help with the incision and catheter discomfort. Tylenol (acetaminophen) or Advil (ibuprofen) which  have no narcotics are better if the pain is not too bad. All medication should be taken as prescribed until the bottles are finished unless you are having an unusual reaction to one of the drugs.  Problems you should report to [place]:  a. Fever greater than 101F. b. Heavy bleeding, or clots (see notes above about blood in urine). c. Inability to urinate. d. Drug reactions (hives, rash, nausea, vomiting, diarrhea). e. Severe burning or pain with urination that is not improving.   No ibuprofen, Advil, Aleve, Motrin, ketorolac, meloxicam, naproxen, or other NSAIDS until after 4:45 pm today if needed.  Post Anesthesia Home Care Instructions  Activity: Get plenty of rest for the remainder of the day. A responsible individual must stay with you for 24 hours following the procedure.  For the next 24 hours, DO NOT: -Drive a car -Advertising copywriter -Drink alcoholic beverages -Take any medication unless instructed by your physician -Make any legal decisions or sign important papers.  Meals: Start with liquid foods such as gelatin or soup. Progress to regular foods as tolerated. Avoid greasy, spicy, heavy foods. If nausea and/or vomiting occur, drink only clear liquids until the nausea and/or vomiting subsides. Call your physician if vomiting continues.  Special Instructions/Symptoms: Your throat may feel dry or sore from the anesthesia or the breathing tube placed in your throat during surgery. If this causes discomfort, gargle with warm salt water. The discomfort should disappear within 24 hours.

## 2023-06-08 NOTE — Interval H&P Note (Signed)
History and Physical Interval Note:  06/08/2023 9:19 AM  Jennifer Shea  has presented today for surgery, with the diagnosis of STRESS URINARY INCONTINENCE.  The various methods of treatment have been discussed with the patient and family. After consideration of risks, benefits and other options for treatment, the patient has consented to  Procedure(s) with comments: CYSTOSCOPY (N/A) - 75 MINUTES MID-URETHRAL SLING (LYNX) (N/A) as a surgical intervention.  The patient's history has been reviewed, patient examined, no change in status, stable for surgery.  I have reviewed the patient's chart and labs.  Questions were answered to the patient's satisfaction.     Zalma Channing D Kishaun Erekson

## 2023-06-08 NOTE — Op Note (Signed)
Operative Note  Preoperative diagnosis:  1.  Stress urinary incontinence  Postoperative diagnosis: 1.  Stress urinary incontinence  Procedure(s): 1.  Mid urethral sling placement [facility] retropubic [employer]) 2.  Cystoscopy  Surgeon: Kasandra Knudsen, MD  Assistants:  Alfredo Martinez, MD  Anesthesia:  General  Complications:  None  EBL:  50mL  Specimens: 1. none  Drains/Catheters: 1.  16Fr foley  Intraoperative findings:   Short urethral length Hypermobility of urethra Grade 1 cystocele Cystoscopy with normal bladder mucosa and bilateral orthotopic ureteral orifices.  Bilateral efflux seen after placement of trocars.  No evidence of trocars seen in bladder, bladder neck or urethra.  Indication:  Jennifer Shea is a 47 y.o. female with symptomatic stress urinary continence here for mid urethral sling.  Description of procedure: After risks and benefits of procedure discussed the patient, informed consent was obtained.  The patient taken the operating placed in supine position.  Anesthesia was induced and antibiotics administered.  Patient was then repositioned in dorsolithotomy position.  She was prepped and draped in usual sterile fashion and a timeout was performed.  A 16 French Foley catheter was then placed in the patient's urethra and the bladder was drained. The Foley catheter was then capped.  cc of quarter percent lidocaine with 1% epinephrine was then injected into the periurethral tissue. The suprapubic incisions were then marked out 1 cm lateral to midline and 1 cm above the pubic bone. Using a 15 blade a stab incision was made in both sides of midline. Gauze is placed over these areas to control the skin bleeding. A 1.5 cm incision was then made in the mid urethra through the vaginal mucosa. Using strully scissors, dissection was then carried out. Her urethra we laterally on both sides to the endopelvic fascia. The dissection was completed once there was enough  space to place a finger through the incision on both sides of the urethra. Using the [facility] needle set both needles were passed through the stab incisions suprapubically down posterior to the pubic bone and then through the periurethral incisions using the index finger to guide the needle out of the incision.   Once both needles had been placed the Foley catheter was removed and cystoscopy was performed. A 30 lens was passed gently through the patient's urethra and into the bladder under visual guidance. A 360 cystoscopic evaluation was performed. There was no mucosal abnormalities or evidence of perforation from the needles. The cystoscope was then removed and the Foley catheter replaced. The bladder was then drained again and the Foley catheter.   The ends of the sling were then attached to the needles and the needles pulled up through the retropubic space and out the suprapubic incision. Once the sling was noted to be centered, the blue plastic cap at the apex of the sling was cut and the plastic sheath was then pulled out. The mesh was noted to be well seated around the urethra. A kelly was used to ensure that the proper amount of tension for the sling around the urethra applied. The sling was noted to be tension free and well positioned. At this point copious amounts of double antibiotic irrigation was then used to irrigate the incision the periurethral space and the vagina. The incision was then closed with 2-0 Vicryl in a running fashion. The stab incisions in the suprapubic area were closed with Dermabond. The vagina was then packed with clindamycin impregnated vaginal packing. The patient was subsequently extubated and returned  to the PACU in stable condition.  Disposition: Vaginal packing will be removed in the PACU. The patient will be sent home with instructions to remove the Foley catheter in 24 hours. She will be given 3 days of antibiotics as well as pain medications. Followup  has been scheduled for 2 weeks.   Plan:  discharge home with antibiotics

## 2023-06-08 NOTE — Anesthesia Preprocedure Evaluation (Signed)
Anesthesia Evaluation  Patient identified by MRN, date of birth, ID band Patient awake    Reviewed: Allergy & Precautions, H&P , NPO status , Patient's Chart, lab work & pertinent test results  Airway Mallampati: II   Neck ROM: full    Dental   Pulmonary neg pulmonary ROS   breath sounds clear to auscultation       Cardiovascular negative cardio ROS  Rhythm:regular Rate:Normal     Neuro/Psych    GI/Hepatic   Endo/Other    Renal/GU      Musculoskeletal   Abdominal   Peds  Hematology   Anesthesia Other Findings   Reproductive/Obstetrics                             Anesthesia Physical Anesthesia Plan  ASA: 2  Anesthesia Plan: General   Post-op Pain Management:    Induction: Intravenous  PONV Risk Score and Plan: 3 and Ondansetron, Dexamethasone, Midazolam and Treatment may vary due to age or medical condition  Airway Management Planned: LMA  Additional Equipment:   Intra-op Plan:   Post-operative Plan: Extubation in OR  Informed Consent: I have reviewed the patients History and Physical, chart, labs and discussed the procedure including the risks, benefits and alternatives for the proposed anesthesia with the patient or authorized representative who has indicated his/her understanding and acceptance.     Dental advisory given  Plan Discussed with: CRNA, Anesthesiologist and Surgeon  Anesthesia Plan Comments:        Anesthesia Quick Evaluation

## 2023-06-08 NOTE — Anesthesia Postprocedure Evaluation (Signed)
Anesthesia Post Note  Patient: Jennifer Shea  Procedure(s) Performed: CYSTOSCOPY (Bladder) MID-URETHRAL SLING (LYNX) (Vagina )     Patient location during evaluation: PACU Anesthesia Type: General Level of consciousness: awake and alert Pain management: pain level controlled Vital Signs Assessment: post-procedure vital signs reviewed and stable Respiratory status: spontaneous breathing, nonlabored ventilation, respiratory function stable and patient connected to nasal cannula oxygen Cardiovascular status: blood pressure returned to baseline and stable Postop Assessment: no apparent nausea or vomiting Anesthetic complications: no   No notable events documented.  Last Vitals:  Vitals:   06/08/23 1115 06/08/23 1130  BP: 111/79 115/76  Pulse: 74 64  Resp: 13 14  Temp:    SpO2: 95% 100%    Last Pain:  Vitals:   06/08/23 1102  TempSrc:   PainSc: Asleep                 Virgil Slinger S

## 2023-06-08 NOTE — Anesthesia Procedure Notes (Signed)
Procedure Name: LMA Insertion Date/Time: 06/08/2023 9:45 AM  Performed by: Bishop Limbo, CRNAPre-anesthesia Checklist: Patient identified, Emergency Drugs available, Suction available and Patient being monitored Patient Re-evaluated:Patient Re-evaluated prior to induction Oxygen Delivery Method: Circle System Utilized Preoxygenation: Pre-oxygenation with 100% oxygen Induction Type: IV induction Ventilation: Mask ventilation without difficulty LMA: LMA inserted LMA Size: 4.0 Number of attempts: 1 Placement Confirmation: positive ETCO2 Tube secured with: Tape Dental Injury: Teeth and Oropharynx as per pre-operative assessment

## 2023-06-08 NOTE — Transfer of Care (Signed)
Immediate Anesthesia Transfer of Care Note  Patient: Jennifer Shea  Procedure(s) Performed: CYSTOSCOPY (Bladder) MID-URETHRAL SLING (LYNX) (Vagina )  Patient Location: PACU  Anesthesia Type:General  Level of Consciousness: awake, alert , oriented, and patient cooperative  Airway & Oxygen Therapy: Patient Spontanous Breathing  Post-op Assessment: Report given to RN and Post -op Vital signs reviewed and stable  Post vital signs: Reviewed and stable  Last Vitals:  Vitals Value Taken Time  BP 117/81 06/08/23 1102  Temp 36.4 C 06/08/23 1058  Pulse 71 06/08/23 1107  Resp 14 06/08/23 1107  SpO2 100 % 06/08/23 1107  Vitals shown include unfiled device data.  Last Pain:  Vitals:   06/08/23 1058  TempSrc:   PainSc: 0-No pain      Patients Stated Pain Goal: 7 (06/08/23 1058)  Complications: No notable events documented.

## 2023-06-10 ENCOUNTER — Encounter (HOSPITAL_BASED_OUTPATIENT_CLINIC_OR_DEPARTMENT_OTHER): Payer: Self-pay | Admitting: Urology

## 2023-06-26 ENCOUNTER — Other Ambulatory Visit: Payer: Self-pay | Admitting: Family Medicine

## 2023-06-26 DIAGNOSIS — F4329 Adjustment disorder with other symptoms: Secondary | ICD-10-CM

## 2023-11-13 ENCOUNTER — Other Ambulatory Visit: Payer: Self-pay | Admitting: Physician Assistant

## 2023-11-13 DIAGNOSIS — N3946 Mixed incontinence: Secondary | ICD-10-CM

## 2023-11-29 ENCOUNTER — Other Ambulatory Visit: Payer: Self-pay | Admitting: Physician Assistant

## 2023-11-29 DIAGNOSIS — N3946 Mixed incontinence: Secondary | ICD-10-CM

## 2023-12-02 ENCOUNTER — Ambulatory Visit: Admitting: Family Medicine

## 2023-12-02 VITALS — BP 122/74 | HR 83 | Temp 98.5°F | Ht 62.0 in | Wt 148.6 lb

## 2023-12-02 DIAGNOSIS — N390 Urinary tract infection, site not specified: Secondary | ICD-10-CM | POA: Diagnosis not present

## 2023-12-02 LAB — POC URINALSYSI DIPSTICK (AUTOMATED)
Bilirubin, UA: NEGATIVE
Blood, UA: NEGATIVE
Glucose, UA: NEGATIVE
Ketones, UA: NEGATIVE
Nitrite, UA: NEGATIVE
Protein, UA: NEGATIVE
Spec Grav, UA: 1.02 (ref 1.010–1.025)
Urobilinogen, UA: 0.2 U/dL — AB
pH, UA: 6 (ref 5.0–8.0)

## 2023-12-02 MED ORDER — BUDESONIDE 3 MG PO CPEP: 3.0000 mg | ORAL_CAPSULE | ORAL | Status: AC

## 2023-12-02 MED ORDER — SULFAMETHOXAZOLE-TRIMETHOPRIM 800-160 MG PO TABS: 1.0000 | ORAL_TABLET | Freq: Two times a day (BID) | ORAL | 0 refills | Status: DC

## 2023-12-02 NOTE — Patient Instructions (Signed)
Drink plenty of water and start the antibiotics today.  We'll contact you with your lab report.  Take care.   

## 2023-12-02 NOTE — Progress Notes (Signed)
 dysuria: yes duration of symptoms: a few days.   abdominal pain: prev had pain, suprapubic.   fevers:no back pain:no vomiting:no Pain with urination.  Ucx pending.  She had 2 weeks off ditropan  but restarted.   Some improvement in sx with taking AZO.   Meds, vitals, and allergies reviewed.   Per HPI unless specifically indicated in ROS section   GEN: nad, alert and oriented HEENT: ncat NECK: supple CV: rrr.  PULM: ctab, no inc wob ABD: soft, +bs, suprapubic area tender EXT: no edema SKIN: well perfused.  BACK: no CVA pain

## 2023-12-04 NOTE — Assessment & Plan Note (Signed)
 Urinalysis discussed.  Urine culture pending.  Start Septra .  Routine cautions given to patient.

## 2023-12-05 ENCOUNTER — Ambulatory Visit: Payer: Self-pay | Admitting: Family Medicine

## 2023-12-05 LAB — URINE CULTURE
MICRO NUMBER:: 16572643
SPECIMEN QUALITY:: ADEQUATE

## 2024-01-28 ENCOUNTER — Ambulatory Visit: Admitting: Family Medicine

## 2024-01-28 ENCOUNTER — Encounter: Payer: Self-pay | Admitting: Family Medicine

## 2024-01-28 VITALS — BP 118/76 | HR 91 | Temp 98.3°F | Ht 62.0 in | Wt 150.1 lb

## 2024-01-28 DIAGNOSIS — K52832 Lymphocytic colitis: Secondary | ICD-10-CM | POA: Diagnosis not present

## 2024-01-28 DIAGNOSIS — Z0000 Encounter for general adult medical examination without abnormal findings: Secondary | ICD-10-CM | POA: Diagnosis not present

## 2024-01-28 DIAGNOSIS — E7800 Pure hypercholesterolemia, unspecified: Secondary | ICD-10-CM

## 2024-01-28 DIAGNOSIS — N3946 Mixed incontinence: Secondary | ICD-10-CM

## 2024-01-28 DIAGNOSIS — R87610 Atypical squamous cells of undetermined significance on cytologic smear of cervix (ASC-US): Secondary | ICD-10-CM

## 2024-01-28 LAB — LIPID PANEL
Cholesterol: 224 mg/dL — ABNORMAL HIGH (ref 0–200)
HDL: 47.2 mg/dL (ref >39.00)
LDL Cholesterol: 148 mg/dL — ABNORMAL HIGH (ref 0–99)
NonHDL: 177.01
Total CHOL/HDL Ratio: 5
Triglycerides: 143 mg/dL (ref 0.0–149.0)
VLDL: 28.6 mg/dL (ref 0.0–40.0)

## 2024-01-28 LAB — CBC WITH DIFFERENTIAL/PLATELET
Basophils Absolute: 0 K/uL (ref 0.0–0.1)
Basophils Relative: 0.3 % (ref 0.0–3.0)
Eosinophils Absolute: 0 K/uL (ref 0.0–0.7)
Eosinophils Relative: 0.1 % (ref 0.0–5.0)
HCT: 39.3 % (ref 36.0–46.0)
Hemoglobin: 13.2 g/dL (ref 12.0–15.0)
Lymphocytes Relative: 26.3 % (ref 12.0–46.0)
Lymphs Abs: 1.4 K/uL (ref 0.7–4.0)
MCHC: 33.5 g/dL (ref 30.0–36.0)
MCV: 89 fl (ref 78.0–100.0)
Monocytes Absolute: 0.4 K/uL (ref 0.1–1.0)
Monocytes Relative: 8.1 % (ref 3.0–12.0)
Neutro Abs: 3.5 K/uL (ref 1.4–7.7)
Neutrophils Relative %: 65.2 % (ref 43.0–77.0)
Platelets: 192 K/uL (ref 150.0–400.0)
RBC: 4.42 Mil/uL (ref 3.87–5.11)
RDW: 13.7 % (ref 11.5–15.5)
WBC: 5.4 K/uL (ref 4.0–10.5)

## 2024-01-28 LAB — COMPREHENSIVE METABOLIC PANEL WITH GFR
ALT: 11 U/L (ref 0–35)
AST: 12 U/L (ref 0–37)
Albumin: 4.2 g/dL (ref 3.5–5.2)
Alkaline Phosphatase: 54 U/L (ref 39–117)
BUN: 17 mg/dL (ref 6–23)
CO2: 25 meq/L (ref 19–32)
Calcium: 9 mg/dL (ref 8.4–10.5)
Chloride: 105 meq/L (ref 96–112)
Creatinine, Ser: 0.78 mg/dL (ref 0.40–1.20)
GFR: 90.78 mL/min (ref >60.00)
Glucose, Bld: 87 mg/dL (ref 70–99)
Potassium: 4.1 meq/L (ref 3.5–5.1)
Sodium: 140 meq/L (ref 135–145)
Total Bilirubin: 0.4 mg/dL (ref 0.2–1.2)
Total Protein: 6.3 g/dL (ref 6.0–8.3)

## 2024-01-28 LAB — TSH: TSH: 1.65 u[IU]/mL (ref 0.35–5.50)

## 2024-01-28 MED ORDER — MIRABEGRON ER 25 MG PO TB24: 25.0000 mg | ORAL_TABLET | Freq: Every day | ORAL | 3 refills | Status: DC

## 2024-01-28 MED ORDER — ASPIRIN-ACETAMINOPHEN-CAFFEINE 250-250-65 MG PO TABS: 2.0000 | ORAL_TABLET | Freq: Three times a day (TID) | ORAL | Status: AC | PRN

## 2024-01-28 NOTE — Assessment & Plan Note (Signed)
 Rec call and schedule GYN f/u

## 2024-01-28 NOTE — Assessment & Plan Note (Addendum)
 Update FLP off medication. Low ASCVD score.  The 10-year ASCVD risk score (Arnett DK, et al., 2019) is: 1.1%   Values used to calculate the score:     Age: 48 years     Clincally relevant sex: Female     Is Non-Hispanic African American: No     Diabetic: No     Tobacco smoker: No     Systolic Blood Pressure: 118 mmHg     Is BP treated: No     HDL Cholesterol: 48.8 mg/dL     Total Cholesterol: 229 mg/dL

## 2024-01-28 NOTE — Assessment & Plan Note (Signed)
 [facility] urology (MacDiarmid) s/p mid-urethral sling placement 05/2023 with marked improvement (Dr Elisabeth).  Continues oxybutyninXL 10mg   Will price out myrbetriq  to use in its place.

## 2024-01-28 NOTE — Assessment & Plan Note (Signed)
 Chronic issue, managed with every other day budesonide . S/p colonoscopy 2024 Sertraline  may have triggered this.  She also notes worse with excedrin  use.

## 2024-01-28 NOTE — Patient Instructions (Addendum)
 Labs today  Call to schedule mammogram at your convenience: [facility] [phone] Price out Myrbetriq  in place of Oxybutynin  - let me know how you do with it. Don't take both together.  Call to schedule well woman exam.  Good to see you today Return as needed or in 1 year for next physical

## 2024-01-28 NOTE — Progress Notes (Signed)
 Ph: [phone] Fax: [phone]   Patient ID: Jennifer Shea, female    DOB: 1976-03-22, 48 y.o.   MRN: [id number]  This visit was conducted in person.  BP 118/76   Pulse 91   Temp 98.3 F (36.8 C) (Oral)   Ht 5' 2 (1.575 m)   Wt 150 lb 2 oz (68.1 kg)   LMP 01/03/2024   SpO2 99%   BMI 27.46 kg/m    CC: CPE Subjective:   HPI: Jennifer Shea is a 48 y.o. 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 Previously saw [facility]. Notes sugared beverages exacerbate incontinence.   Lymphocytic colitis - treated with course of budesonide  - improved off sertraline , also continues budesonide  3mg  qod. Also notes excedrin  exacerbates diarrhea.   Preventative: COLONOSCOPY WITH PROPOFOL  02/24/2023 - int hem, biopsy: lymphocytic colitis Romero Bi, MD)  Mammogram - Birads1 12/2020 @ [place]. Does not do breast exams at home. No fmhx breast cancer. Encouraged repeat mammo.  Well woman exam - sees [facility], last visit 12/2020 with pap showing ASCUS - rec rpt 1 yr - has not returned, will call to schedule LMP - 01/03/2024, regular Lung cancer screening - not eligible DEXA scan - not due  Flu shot - not recently  COVID vaccine - declines  Td 1994, Tdap 03/2022  Pneumonia shot - not due yet  Shingrix - not due  Advanced directive discussion -  Seat belt use discussed Sunscreen use discussed. No changing moles on skin.  Smoking - non smoker Alcohol - none Dentist - yearly  Eye exam - yearly  Bowel - no constipation Bladder - see above   Lives with husband and FIL Grown children Occ: [employer], [place] Activity: walks 1.5 mi daily at work  Nutrition: good water , fruits/vegetables daily      Relevant past medical, surgical, family and social history reviewed and updated as indicated. Interim medical history  since our last visit reviewed. Allergies and medications reviewed and updated. Outpatient Medications Prior to Visit  Medication Sig Dispense Refill   Ascorbic Acid (VITAMIN C) 1000 MG tablet Take 1,000 mg by mouth daily.     Biotin 5 MG CAPS Take 5 mg by mouth daily.     budesonide  (ENTOCORT EC ) 3 MG 24 hr capsule Take 1 capsule (3 mg total) by mouth every other day.     CRANBERRY PO Take by mouth.     Multiple Vitamin (MULTIVITAMIN WITH MINERALS) TABS tablet Take 1 tablet by mouth daily.     Omega-3 Fatty Acids (FISH OIL) 1000 MG CAPS Take 2,000 mg by mouth.     oxybutynin  (DITROPAN -XL) 10 MG 24 hr tablet TAKE 1 TABLET BY MOUTH EVERY DAY 90 tablet 3   magnesium 30 MG tablet Take 30 mg by mouth 2 (two) times daily.     sulfamethoxazole -trimethoprim  (BACTRIM  DS) 800-160 MG tablet Take 1 tablet by mouth 2 (two) times daily. 6 tablet 0   No facility-administered medications prior to visit.     Per HPI unless specifically indicated in ROS section below Review of Systems  Constitutional:  Negative for activity change, appetite change, chills, fatigue, fever and unexpected weight change.  HENT:  Negative for hearing loss.   Eyes:  Negative for visual disturbance.  Respiratory:  Negative for cough, chest tightness, shortness of breath and wheezing.  Cardiovascular:  Negative for chest pain, palpitations and leg swelling.  Gastrointestinal:  Negative for abdominal distention, abdominal pain, blood in stool, constipation, diarrhea, nausea and vomiting.  Genitourinary:  Negative for difficulty urinating and hematuria.  Musculoskeletal:  Negative for arthralgias, myalgias and neck pain.  Skin:  Negative for rash.  Neurological:  Positive for headaches (occ). Negative for dizziness, seizures and syncope.  Hematological:  Negative for adenopathy. Does not bruise/bleed easily.  Psychiatric/Behavioral:  Negative for dysphoric mood. The patient is not nervous/anxious.     Objective:  BP 118/76    Pulse 91   Temp 98.3 F (36.8 C) (Oral)   Ht 5' 2 (1.575 m)   Wt 150 lb 2 oz (68.1 kg)   LMP 01/03/2024   SpO2 99%   BMI 27.46 kg/m   Wt Readings from Last 3 Encounters:  01/28/24 150 lb 2 oz (68.1 kg)  12/02/23 148 lb 9.6 oz (67.4 kg)  06/08/23 151 lb 9.6 oz (68.8 kg)      Physical Exam Vitals and nursing note reviewed.  Constitutional:      Appearance: Normal appearance. She is not ill-appearing.  HENT:     Head: Normocephalic and atraumatic.     Right Ear: Tympanic membrane, ear canal and external ear normal. There is no impacted cerumen.     Left Ear: Tympanic membrane, ear canal and external ear normal. There is no impacted cerumen.     Mouth/Throat:     Mouth: Mucous membranes are moist.     Pharynx: Oropharynx is clear. No oropharyngeal exudate or posterior oropharyngeal erythema.  Eyes:     General:        Right eye: No discharge.        Left eye: No discharge.     Extraocular Movements: Extraocular movements intact.     Conjunctiva/sclera: Conjunctivae normal.     Pupils: Pupils are equal, round, and reactive to light.  Neck:     Thyroid : No thyroid  mass or thyromegaly.  Cardiovascular:     Rate and Rhythm: Normal rate and regular rhythm.     Pulses: Normal pulses.     Heart sounds: Normal heart sounds. No murmur heard. Pulmonary:     Effort: Pulmonary effort is normal. No respiratory distress.     Breath sounds: Normal breath sounds. No wheezing, rhonchi or rales.  Abdominal:     General: Bowel sounds are normal. There is no distension.     Palpations: Abdomen is soft. There is no mass.     Tenderness: There is no abdominal tenderness. There is no guarding or rebound.     Hernia: No hernia is present.  Musculoskeletal:     Cervical back: Normal range of motion and neck supple. No rigidity.     Right lower leg: No edema.     Left lower leg: No edema.  Lymphadenopathy:     Cervical: No cervical adenopathy.  Skin:    General: Skin is warm and dry.      Findings: No rash.  Neurological:     General: No focal deficit present.     Mental Status: She is alert. Mental status is at baseline.  Psychiatric:        Mood and Affect: Mood normal.        Behavior: Behavior normal.        Assessment & Plan:   Problem List Items Addressed This Visit     Health maintenance examination - Primary (Chronic)   Preventative protocols reviewed  and updated unless pt declined. Discussed healthy diet and lifestyle.       Mixed stress and urge incontinence   [facility] (MacDiarmid) s/p mid-urethral sling placement 05/2023 with marked improvement (Dr Elisabeth).  Continues oxybutyninXL 10mg   Will price out myrbetriq  to use in its place.       Hyperlipidemia   Update FLP off medication. Low ASCVD score.  The 10-year ASCVD risk score (Arnett DK, et al., 2019) is: 1.1%   Values used to calculate the score:     Age: 80 years     Clincally relevant sex: Female     Is Non-Hispanic African American: No     Diabetic: No     Tobacco smoker: No     Systolic Blood Pressure: 118 mmHg     Is BP treated: No     HDL Cholesterol: 48.8 mg/dL     Total Cholesterol: 229 mg/dL       Relevant Orders   Lipid panel   Comprehensive metabolic panel with GFR   ASCUS of cervix with negative high risk HPV   Rec call and schedule GYN f/u       Lymphocytic colitis   Chronic issue, managed with every other day budesonide . S/p colonoscopy 2024 Sertraline  may have triggered this.  She also notes worse with excedrin  use.       Relevant Orders   CBC with Differential/Platelet   TSH     Meds ordered this encounter  Medications   mirabegron  ER (MYRBETRIQ ) 25 MG TB24 tablet    Sig: Take 1 tablet (25 mg total) by mouth daily.    Dispense:  30 tablet    Refill:  3   aspirin -acetaminophen -caffeine  (EXCEDRIN  MIGRAINE) 250-250-65 MG tablet    Sig: Take 2 tablets by mouth 3 (three) times daily as needed for headache (periods).    Orders Placed This  Encounter  Procedures   Lipid panel   Comprehensive metabolic panel with GFR   CBC with Differential/Platelet   TSH    Patient Instructions  Labs today  Call to schedule mammogram at your convenience: [facility] [phone] Price out Myrbetriq  in place of Oxybutynin  - let me know how you do with it. Don't take both together.  Call to schedule well woman exam.  Good to see you today Return as needed or in 1 year for next physical   Follow up plan: Return in about 1 year (around 01/27/2025), or if symptoms worsen or fail to improve, for annual exam, prior fasting for blood work.  Anton Blas, MD

## 2024-01-28 NOTE — Assessment & Plan Note (Signed)
 Preventative protocols reviewed and updated unless pt declined. Discussed healthy diet and lifestyle.

## 2024-01-31 ENCOUNTER — Ambulatory Visit: Payer: Self-pay | Admitting: Family Medicine

## 2024-05-30 ENCOUNTER — Other Ambulatory Visit: Payer: Self-pay | Admitting: Family Medicine

## 2024-05-30 NOTE — Telephone Encounter (Signed)
 ERx

## 2024-05-30 NOTE — Telephone Encounter (Signed)
 Started in August asked patient to call and give update. Don't see were that was done. Please advise.

## 2024-07-05 ENCOUNTER — Ambulatory Visit: Admitting: Family Medicine

## 2024-07-09 NOTE — Progress Notes (Unsigned)
" ° ° ° °  Marlaina Coburn T. Mry Lamia, MD, CAQ Sports Medicine [facility] [address] [place], [address]  Phone: [phone]  FAX: [phone]  Jennifer Shea - 49 y.o. female  MRN [id number]  Date of Birth: May 17, 1976  Date: 07/10/2024  PCP: Rilla Baller, MD  Referral: Rilla Baller, MD  No chief complaint on file.  Subjective:   Jennifer Shea is a 49 y.o. very pleasant female patient with There is no height or weight on file to calculate BMI. who presents with the following:  Discussed the use of AI scribe software for clinical note transcription with the patient, who gave verbal consent to proceed.  Patient presents for evaluation of ongoing left-sided knee pain. History of Present Illness     Review of Systems is noted in the HPI, as appropriate  Objective:   There were no vitals taken for this visit.  GEN: No acute distress; alert,appropriate. PULM: Breathing comfortably in no respiratory distress PSYCH: Normally interactive.   Laboratory and Imaging Data:  Assessment and Plan:   No diagnosis found. Assessment & Plan   Medication Management during today's office visit: No orders of the defined types were placed in this encounter.  There are no discontinued medications.  Orders placed today for conditions managed today: No orders of the defined types were placed in this encounter.   Disposition: No follow-ups on file.  [employer] speech-to-text software was used for transcription in this dictation.  Possible transcriptional errors can occur using Animal nutritionist.   Signed,  Jacques DASEN. Rio Kidane, MD   Outpatient Encounter Medications as of 07/10/2024  Medication Sig   Ascorbic Acid (VITAMIN C) 1000 MG tablet Take 1,000 mg by mouth daily.   aspirin -acetaminophen -caffeine  (EXCEDRIN  MIGRAINE) 250-250-65 MG tablet Take 2 tablets by mouth 3 (three) times daily as needed for headache (periods).   Biotin 5 MG  CAPS Take 5 mg by mouth daily.   budesonide  (ENTOCORT EC ) 3 MG 24 hr capsule Take 1 capsule (3 mg total) by mouth every other day.   CRANBERRY PO Take by mouth.   mirabegron  ER (MYRBETRIQ ) 25 MG TB24 tablet TAKE 1 TABLET (25 MG TOTAL) BY MOUTH DAILY.   Multiple Vitamin (MULTIVITAMIN WITH MINERALS) TABS tablet Take 1 tablet by mouth daily.   Omega-3 Fatty Acids (FISH OIL) 1000 MG CAPS Take 2,000 mg by mouth.   oxybutynin  (DITROPAN -XL) 10 MG 24 hr tablet TAKE 1 TABLET BY MOUTH EVERY DAY   No facility-administered encounter medications on file as of 07/10/2024.   "

## 2024-07-10 ENCOUNTER — Encounter: Payer: Self-pay | Admitting: Family Medicine

## 2024-07-10 ENCOUNTER — Ambulatory Visit: Admitting: Family Medicine

## 2024-07-10 VITALS — BP 138/90 | HR 89 | Temp 98.8°F | Ht 62.0 in | Wt 152.5 lb

## 2024-07-10 DIAGNOSIS — M25562 Pain in left knee: Secondary | ICD-10-CM

## 2024-07-11 ENCOUNTER — Encounter: Payer: Self-pay | Admitting: Family Medicine
